# Patient Record
Sex: Male | Born: 1990 | Race: White | Hispanic: No | Marital: Single | Smoking: Current every day smoker
Health system: Southern US, Community
[De-identification: ages and names within clinical notes are randomized; demographics above are authoritative.]

## PROBLEM LIST (undated history)

## (undated) HISTORY — PX: WISDOM TOOTH EXTRACTION: SHX21

---
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
## 2015-08-20 ENCOUNTER — Emergency Department (HOSPITAL_COMMUNITY)
Admission: EM | Admit: 2015-08-20 | Discharge: 2015-08-20 | Disposition: A | Discharge status: Home / Self Care | Payer: BLUE CROSS/BLUE SHIELD | Attending: Emergency Medicine | Admitting: Emergency Medicine

## 2015-08-20 ENCOUNTER — Encounter (HOSPITAL_COMMUNITY): Payer: Self-pay

## 2015-08-20 DIAGNOSIS — K0889 Other specified disorders of teeth and supporting structures: Secondary | ICD-10-CM | POA: Diagnosis present

## 2015-08-20 DIAGNOSIS — K029 Dental caries, unspecified: Secondary | ICD-10-CM | POA: Insufficient documentation

## 2015-08-20 DIAGNOSIS — F17210 Nicotine dependence, cigarettes, uncomplicated: Secondary | ICD-10-CM | POA: Insufficient documentation

## 2015-08-20 MED ORDER — KETOROLAC TROMETHAMINE 10 MG PO TABS
10.0000 mg | ORAL_TABLET | Freq: Once | ORAL | Status: AC
  Administered 2015-08-20: 10 mg via ORAL
  Filled 2015-08-20: qty 1

## 2015-08-20 MED ORDER — CLINDAMYCIN HCL 150 MG PO CAPS
300.0000 mg | ORAL_CAPSULE | Freq: Once | ORAL | Status: AC
  Administered 2015-08-20: 300 mg via ORAL
  Filled 2015-08-20: qty 2

## 2015-08-20 MED ORDER — CLINDAMYCIN HCL 300 MG PO CAPS: 300.0000 mg | ORAL_CAPSULE | Freq: Three times a day (TID) | ORAL | Status: DC

## 2015-08-20 MED ORDER — ONDANSETRON HCL 4 MG PO TABS
4.0000 mg | ORAL_TABLET | Freq: Once | ORAL | Status: AC
  Administered 2015-08-20: 4 mg via ORAL
  Filled 2015-08-20: qty 1

## 2015-08-20 MED ORDER — TRAMADOL HCL 50 MG PO TABS: 50.0000 mg | ORAL_TABLET | Freq: Four times a day (QID) | ORAL | Status: DC | PRN

## 2015-08-20 MED ORDER — CLINDAMYCIN HCL 150 MG PO CAPS: ORAL_CAPSULE | ORAL | Status: DC

## 2015-08-20 NOTE — Discharge Instructions (Signed)
It is important that you see a dentist systems possible. Please use 600 mg of ibuprofen every 6 hours for mild pain, use Ultram every 6 hours for more severe pain. Please use clindamycin 3 times daily with food until all taken. Dental Caries Dental caries is tooth decay. This decay can cause a hole in teeth (cavity) that can get bigger and deeper over time. HOME CARE  Brush and floss your teeth. Do this at least two times a day.  Use a fluoride toothpaste.  Use a mouth rinse if told by your dentist or doctor.  Eat less sugary and starchy foods. Drink less sugary drinks.  Avoid snacking often on sugary and starchy foods. Avoid sipping often on sugary drinks.  Keep regular checkups and cleanings with your dentist.  Use fluoride supplements if told by your dentist or doctor.  Allow fluoride to be applied to teeth if told by your dentist or doctor.   This information is not intended to replace advice given to you by your health care provider. Make sure you discuss any questions you have with your health care provider.   Document Released: 01/26/2008 Document Revised: 05/09/2014 Document Reviewed: 04/20/2012 Elsevier Interactive Patient Education 2016 [address] is a great source of information about community services available.  Access by dialing 2-1-1 from anywhere in [place], or by website -  CustodianSupply.fi.   Other Local Resources (Updated 05/2015)  Dental  Care   Services    Phone Number and Address  Cost  [facility] For children 73 - 66 years of age:   Cleaning  Tooth brushing/flossing instruction  Sealants, fillings, crowns  Extractions  Emergency treatment  [phone] [address], [place] [address] Charges based on family income.  Medicaid and some insurance plans accepted.     [facility] - [facility], fillings, crowns  Extractions  Emergency treatment [phone] [address], [place]  Pregnant women 75 years of age or older with a Medicaid card  [facility] - [place]  Cleaning  Sealants, fillings, crowns  Extractions  Emergency treatment [phone] [address] [place], [place] Pregnant women 77 years of age or older with a Medicaid card  [facility] For children 10 - 62 years of age:   Cleaning  Tooth brushing/flossing instruction  Sealants, fillings, crowns  Extractions  Emergency treatment Limited orthodontic services for patients with Medicaid [phone] [address], [place] [address] Medicaid and [facility] Health Choice cover for children up to age 70 and pregnant women.  Parents of children up to age 71 without Medicaid pay a reduced fee at time of service.  [facility] For children 32 - 4 years of age:   Cleaning  Tooth brushing/flossing instruction  Sealants, fillings, crowns  Extractions  Emergency treatment Limited orthodontic services for patients with Medicaid [phone] [place], [place].  Medicaid and [facility] Health Choice cover for children up to age 36 and pregnant women.  Parents of children up to age 53 without Medicaid pay a reduced fee.  [facility]  Sealants, fillings, crowns  Extractions  Hours: Tuesdays and Thursdays, 4:15 - 8 pm [phone] [address] [place], [place] [address] Services free of  charge to [facility] residents ages 18-64 who do not have health insurance, Medicare, [place], or [place] benefits and fall within [place] care in addition to primary medical care, nutritional counseling, and  pharmacy:  Cleaning  Sealants, fillings, crowns  Extractions                  [phone] [facility], [place], [place], [place], [place], [place] [facility], [place], [place] [facility] [place], [place], [place], most insurance.  Also provides services available to all with fees adjusted based on ability to pay.    [facility]  Cleaning  Tooth brushing/flossing instruction  Sealants, fillings, crowns  Extractions  Emergency treatment Hours: Tuesdays, Thursdays, and Fridays from 8 am to 5 pm by appointment only. [phone] [place], [place] [address] [facility] residents with Medicaid (depending on eligibility) and children with [facility] Health Choice - call for more information.  [facility]  Extractions only  Hours: 2nd and 4th Thursday of each month from 6:30 am - 9 am.   [phone] ext. [address] [place], [place] [address] Ages 7 and older only.  Patients are seen on a first come, first served basis.  [employer]  [employer]  Extractions  Orthodontics  Endodontics  Implants/Crowns/Bridges  Complete and partial dentures [phone] [place], [place] Patients must complete an application for services.  There is often a waiting list.

## 2015-08-20 NOTE — ED Provider Notes (Signed)
CSN: [id number]     Arrival date & time 08/20/15  2138 History   First MD Initiated Contact with Patient 08/20/15 2210     Chief Complaint  Patient presents with  . Dental Pain     (Consider location/radiation/quality/duration/timing/severity/associated sxs/prior Treatment) Patient is a 25 y.o. male presenting with tooth pain. The history is provided by the patient.  Dental Pain Location:  Lower Context: dental caries and poor dentition   Relieved by:  Nothing Worsened by:  Cold food/drink Associated symptoms: facial pain   Associated symptoms: no fever and no trismus   Risk factors: lack of dental care and smoking   Risk factors: no diabetes     History reviewed. No pertinent past medical history. History reviewed. No pertinent past surgical history. No family history on file. Social History  Substance Use Topics  . Smoking status: Current Every Day Smoker -- 0.50 packs/day    Types: Cigarettes  . Smokeless tobacco: None  . Alcohol Use: No    Review of Systems  Constitutional: Negative for fever.  HENT: Positive for dental problem.   All other systems reviewed and are negative.     Allergies  Codeine and Penicillins  Home Medications   Prior to Admission medications   Not on File   BP 128/80 mmHg  Pulse 75  Temp(Src) 98 F (36.7 C) (Oral)  Ht 5\' 8"  (1.727 m)  Wt 108.863 kg  BMI 36.50 kg/m2  SpO2 100% Physical Exam  Constitutional: He is oriented to person, place, and time. He appears well-developed and well-nourished.  Non-toxic appearance.  HENT:  Head: Normocephalic.  Right Ear: Tympanic membrane and external ear normal.  Left Ear: Tympanic membrane and external ear normal.  There are deep cavities of the right lower molars. There is swelling of the gum at the right lower molar area. There are also deep cavities of the left lower molar area. There is no swelling under the tongue. The airway is patent.  Eyes: EOM and lids are normal. Pupils are equal,  round, and reactive to light.  Neck: Normal range of motion. Neck supple. Carotid bruit is not present.  Cardiovascular: Normal rate, regular rhythm, normal heart sounds, intact distal pulses and normal pulses.   Pulmonary/Chest: Breath sounds normal. No respiratory distress.  Abdominal: Soft. Bowel sounds are normal. There is no tenderness. There is no guarding.  Musculoskeletal: Normal range of motion.  Lymphadenopathy:       Head (right side): No submandibular adenopathy present.       Head (left side): No submandibular adenopathy present.    He has no cervical adenopathy.  Neurological: He is alert and oriented to person, place, and time. He has normal strength. No cranial nerve deficit or sensory deficit.  Skin: Skin is warm and dry.  Psychiatric: He has a normal mood and affect. His speech is normal.  Nursing note and vitals reviewed.   ED Course  Procedures (including critical care time) Labs Review Labs Reviewed - No data to display  Imaging Review No results found. I have personally reviewed and evaluated these images and lab results as part of my medical decision-making.   EKG Interpretation None      MDM  Vital signs are well within normal limits. There is no evidence for Ludwig's angina or other acute problems. The patient will be treated with clindamycin and ibuprofen and Ultram. The patient is given none dental resource list.    Final diagnoses:  None    *I have  reviewed nursing notes, vital signs, and all appropriate lab and imaging results for this patient.[address] PA-C 08/20/15 2222  Tanna Furry, MD 08/31/15 [phone]

## 2015-08-20 NOTE — ED Notes (Signed)
Started having tooth pain yesterday, hurting on the right lower side per pt.

## 2016-01-30 ENCOUNTER — Emergency Department (HOSPITAL_COMMUNITY)
Admission: EM | Admit: 2016-01-30 | Discharge: 2016-01-30 | Disposition: A | Discharge status: Home / Self Care | Payer: BLUE CROSS/BLUE SHIELD | Attending: Emergency Medicine | Admitting: Emergency Medicine

## 2016-01-30 ENCOUNTER — Emergency Department (HOSPITAL_COMMUNITY): Payer: BLUE CROSS/BLUE SHIELD

## 2016-01-30 ENCOUNTER — Encounter (HOSPITAL_COMMUNITY): Payer: Self-pay | Admitting: Emergency Medicine

## 2016-01-30 DIAGNOSIS — Y929 Unspecified place or not applicable: Secondary | ICD-10-CM | POA: Diagnosis not present

## 2016-01-30 DIAGNOSIS — S62306A Unspecified fracture of fifth metacarpal bone, right hand, initial encounter for closed fracture: Secondary | ICD-10-CM

## 2016-01-30 DIAGNOSIS — Y999 Unspecified external cause status: Secondary | ICD-10-CM | POA: Diagnosis not present

## 2016-01-30 DIAGNOSIS — S62316A Displaced fracture of base of fifth metacarpal bone, right hand, initial encounter for closed fracture: Secondary | ICD-10-CM | POA: Insufficient documentation

## 2016-01-30 DIAGNOSIS — Y9389 Activity, other specified: Secondary | ICD-10-CM | POA: Insufficient documentation

## 2016-01-30 DIAGNOSIS — Z791 Long term (current) use of non-steroidal anti-inflammatories (NSAID): Secondary | ICD-10-CM | POA: Diagnosis not present

## 2016-01-30 DIAGNOSIS — F17210 Nicotine dependence, cigarettes, uncomplicated: Secondary | ICD-10-CM | POA: Insufficient documentation

## 2016-01-30 DIAGNOSIS — S6991XA Unspecified injury of right wrist, hand and finger(s), initial encounter: Secondary | ICD-10-CM | POA: Diagnosis present

## 2016-01-30 MED ORDER — CLINDAMYCIN HCL 300 MG PO CAPS: 300.0000 mg | ORAL_CAPSULE | Freq: Four times a day (QID) | ORAL | 0 refills | Status: DC

## 2016-01-30 MED ORDER — SULFAMETHOXAZOLE-TRIMETHOPRIM 800-160 MG PO TABS: 1.0000 | ORAL_TABLET | Freq: Two times a day (BID) | ORAL | 0 refills | Status: AC

## 2016-01-30 NOTE — ED Notes (Signed)
Patient transported to X-ray 
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
## 2016-01-30 NOTE — ED Notes (Signed)
Pt returned from xray
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
## 2016-06-13 ENCOUNTER — Encounter (HOSPITAL_COMMUNITY): Payer: Self-pay | Admitting: Emergency Medicine

## 2016-06-13 ENCOUNTER — Emergency Department (HOSPITAL_COMMUNITY)
Admission: EM | Admit: 2016-06-13 | Discharge: 2016-06-14 | Disposition: A | Discharge status: Home / Self Care | Payer: BLUE CROSS/BLUE SHIELD | Attending: Emergency Medicine | Admitting: Emergency Medicine

## 2016-06-13 DIAGNOSIS — R197 Diarrhea, unspecified: Secondary | ICD-10-CM | POA: Diagnosis not present

## 2016-06-13 DIAGNOSIS — R112 Nausea with vomiting, unspecified: Secondary | ICD-10-CM | POA: Diagnosis present

## 2016-06-13 DIAGNOSIS — F17210 Nicotine dependence, cigarettes, uncomplicated: Secondary | ICD-10-CM | POA: Diagnosis not present

## 2016-06-13 DIAGNOSIS — Z791 Long term (current) use of non-steroidal anti-inflammatories (NSAID): Secondary | ICD-10-CM | POA: Diagnosis not present

## 2016-06-13 LAB — COMPREHENSIVE METABOLIC PANEL
ALT: 30 U/L (ref 17–63)
AST: 26 U/L (ref 15–41)
Albumin: 4.4 g/dL (ref 3.5–5.0)
Alkaline Phosphatase: 84 U/L (ref 38–126)
Anion gap: 11 (ref 5–15)
BUN: 17 mg/dL (ref 6–20)
CHLORIDE: 104 mmol/L (ref 101–111)
CO2: 24 mmol/L (ref 22–32)
CREATININE: 1.02 mg/dL (ref 0.61–1.24)
Calcium: 9.3 mg/dL (ref 8.9–10.3)
GFR calc non Af Amer: >60 mL/min (ref >60)
Glucose, Bld: 122 mg/dL — ABNORMAL HIGH (ref 65–99)
POTASSIUM: 3.7 mmol/L (ref 3.5–5.1)
SODIUM: 139 mmol/L (ref 135–145)
Total Bilirubin: 0.7 mg/dL (ref 0.3–1.2)
Total Protein: 7.6 g/dL (ref 6.5–8.1)

## 2016-06-13 LAB — CBC
HEMATOCRIT: 51 % (ref 39.0–52.0)
HEMOGLOBIN: 18 g/dL — AB (ref 13.0–17.0)
MCH: 31.4 pg (ref 26.0–34.0)
MCHC: 35.3 g/dL (ref 30.0–36.0)
MCV: 88.9 fL (ref 78.0–100.0)
PLATELETS: 250 10*3/uL (ref 150–400)
RBC: 5.74 MIL/uL (ref 4.22–5.81)
RDW: 13 % (ref 11.5–15.5)
WBC: 13.8 10*3/uL — AB (ref 4.0–10.5)

## 2016-06-13 LAB — LIPASE, BLOOD: LIPASE: 14 U/L (ref 11–51)

## 2016-06-13 NOTE — ED Triage Notes (Signed)
Per pt has been vomiting and having diarrhea all day

## 2016-06-14 MED ORDER — ONDANSETRON 4 MG PO TBDP: 4.0000 mg | ORAL_TABLET | Freq: Three times a day (TID) | ORAL | 0 refills | Status: DC | PRN

## 2016-06-14 MED ORDER — ONDANSETRON 4 MG PO TBDP
4.0000 mg | ORAL_TABLET | Freq: Once | ORAL | Status: AC
  Administered 2016-06-14: 4 mg via ORAL
  Filled 2016-06-14: qty 1

## 2016-06-14 NOTE — Discharge Instructions (Signed)
The workup we did in the emergency department is overall normal. Urine nausea, vomiting and diarrhea are most likely due to a viral gastroenteritis or stomach bug.  Please read attached information on nausea, vomiting, diarrhea and viral gastroenteritis.   Make sure you stay well hydrated, drink plenty of water.    Good hand hygiene is important to prevent spread of your symptoms to those around you. You may return to work if you feel well, do not have a fever and are not vomiting or having diarrhea.  You may use your work note as needed.    Your symptoms should start to improve in the next 2-3 days.    Please take zofran for nausea as needed.  Your diarrhea should start slowing down over the next couple of days.  You may take over the counter imodium for day time disruptive diarrhea.    Avoid irritating foods including greasy fried food, spicy food, coffee, alcohol and dairy until your symptoms have improved.   Return to the emergency department if your symptoms do not improve or worsen in the next 3-5 days or if you unable to keep fluids down, develop fever, abdominal pain or blood in stool.

## 2016-06-14 NOTE — ED Provider Notes (Signed)
[facility] DEPT Provider Note   CSN: [id number] Arrival date & time: 06/13/16  2235     History   Chief Complaint Chief Complaint  Patient presents with  . Emesis  . Diarrhea    HPI Cameron Chavez is a 26 y.o. male with no known past medical or surgical history presents to ED with sudden onset nausea, non bloody vomiting and non bloody/non mucoid diarrhea since 1600 today.  Patient states he had chili and a patty melt at work around noon today for lunch.  Patient denies fever, abdominal pain, dark/black stools, abdominal distention. No URI symptoms including nasal congestion, rhinorrhea, cough, sore throat, body aches.  No recent travel, intake of unfiltered water, antibiotics, fish or shellfish intake.  No previous abdominal surgeries. No new medications.  No heavy use of NSAIDs or ETOH. No known PUD.    HPI  History reviewed. No pertinent past medical history.  There are no active problems to display for this patient.   Past Surgical History:  Procedure Laterality Date  . WISDOM TOOTH EXTRACTION         Home Medications    Prior to Admission medications   Medication Sig Start Date End Date Taking? Authorizing Provider  clindamycin (CLEOCIN) 300 MG capsule Take 1 capsule (300 mg total) by mouth 4 (four) times daily. X 7 days 01/30/16   [name], MD  ibuprofen (ADVIL,MOTRIN) 200 MG tablet Take 200 mg by mouth every 6 (six) hours as needed for mild pain or moderate pain.    Historical Provider, MD  ondansetron (ZOFRAN ODT) 4 MG disintegrating tablet Take 1 tablet (4 mg total) by mouth every 8 (eight) hours as needed for nausea or vomiting. 06/14/16   [name], PA-C    Family History Family History  Problem Relation Age of Onset  . Diabetes Other     Social History Social History  Substance Use Topics  . Smoking status: Current Every Day Smoker    Packs/day: 0.50    Years: 10.00    Types: Cigarettes  . Smokeless tobacco: Never Used  . Alcohol  use No     Allergies   Codeine and Penicillins   Review of Systems Review of Systems  Constitutional: Negative for chills and fever.  HENT: Negative for congestion, postnasal drip, rhinorrhea and sore throat.   Eyes: Negative for visual disturbance.  Respiratory: Negative for cough and shortness of breath.   Cardiovascular: Negative for chest pain and palpitations.  Gastrointestinal: Positive for diarrhea, nausea and vomiting. Negative for abdominal pain, anal bleeding, blood in stool and constipation.  Genitourinary: Negative for difficulty urinating, flank pain and hematuria.  Musculoskeletal: Negative for joint swelling and myalgias.  Skin: Negative for rash.  Neurological: Negative for dizziness, syncope, weakness, light-headedness and headaches.  Hematological: Negative.   Psychiatric/Behavioral: Negative.      Physical Exam Updated Vital Signs BP 121/87 (BP Location: Left Arm)   Pulse 115   Temp 98.9 F (37.2 C) (Oral)   Resp 20   Ht 5\' 10"  (1.778 m)   Wt 106.6 kg   SpO2 97%   BMI 33.72 kg/m   Physical Exam  Constitutional: He is oriented to person, place, and time. He appears well-developed and well-nourished. No distress.  NAD.  HENT:  Head: Normocephalic and atraumatic.  Nose: Nose normal.  Mouth/Throat: Oropharynx is clear and moist. No oropharyngeal exudate.  Moist mucous membranes.  Oropharynx and tonsils pink without erythema, edema, exudates or lesions.  Uvula midline. No trismus.  Eyes: Conjunctivae and EOM are normal. Pupils are equal, round, and reactive to light.  Neck: Normal range of motion. Neck supple. No JVD present. No tracheal deviation present.  Cardiovascular: Normal rate, regular rhythm, normal heart sounds and intact distal pulses.   No murmur heard. Pulmonary/Chest: Effort normal and breath sounds normal. No respiratory distress. He has no wheezes. He has no rales.  Abdominal:  No surgical abdominal scars noted.  No pulsating  masses.  No abdominal or inguinal hernias noted. + Bowel sounds throughout.  Abdomen is soft, non tender without distention, rigidity, guarding or rebound.  No suprapubic tenderness. No CVAT.  Negative Murphy's. Negative McBurney's.  Non palpable kidneys. No hepatosplenomegaly.   Musculoskeletal: Normal range of motion. He exhibits no deformity.  Lymphadenopathy:    He has no cervical adenopathy.  Neurological: He is alert and oriented to person, place, and time.  Skin: Skin is warm and dry. Capillary refill takes less than 2 seconds.  Psychiatric: He has a normal mood and affect. His behavior is normal. Judgment and thought content normal.  Nursing note and vitals reviewed.    ED Treatments / Results  Labs (all labs ordered are listed, but only abnormal results are displayed) Labs Reviewed  COMPREHENSIVE METABOLIC PANEL - Abnormal; Notable for the following:       Result Value   Glucose, Bld 122 (*)    All other components within normal limits  CBC - Abnormal; Notable for the following:    WBC 13.8 (*)    Hemoglobin 18.0 (*)    All other components within normal limits  LIPASE, BLOOD    EKG  EKG Interpretation None       Radiology No results found.  Procedures Procedures (including critical care time)  Medications Ordered in ED Medications  ondansetron (ZOFRAN-ODT) disintegrating tablet 4 mg (4 mg Oral Given 06/14/16 0027)     Initial Impression / Assessment and Plan / ED Course  I have reviewed the triage vital signs and the nursing notes.  Pertinent labs & imaging results that were available during my care of the patient were reviewed by me and considered in my medical decision making (see chart for details).     26 y.o. yo male with no pertinent past medical or surgical history presents with nausea, NBNB vomiting and non bloody/non mucoid diarrhea since 1600 today.  ANo recent travel, intake of unfiltered water, intake of fish/shellfish or uncooked meat,  no recent antibiotics.  Patient is immunocompetent.  Vital signs within normal limits.  Patient is non toxic appearing, does not appear to be volume depleted.  Non tender abdomen.  Lab work normal. Patient was given zofran and PO fluids in the ED for symptoms.  Patient tolerated PO challenge.  Suspect this is noninflammatory acute diarrhea most likely viral in nature, possibly due to food poisoning.  Doubt bacterial or parasitic noninflammatory etiologies.  Doubt inflammatory etiologies. Doubt c.diff.  Doubt GIB. Patient will be discharged with instructions to hydrate and conservative therapies for nausea, vomiting and diarrhea including imodium.  Strict ED return precautions. Patient verbalized understanding and agreeable to discharge plan.    Final Clinical Impressions(s) / ED Diagnoses   Final diagnoses:  Nausea vomiting and diarrhea    New Prescriptions Discharge Medication List as of 06/14/2016  1:12 AM    START taking these medications   Details  ondansetron (ZOFRAN ODT) 4 MG disintegrating tablet Take 1 tablet (4 mg total) by mouth every 8 (eight) hours as needed for nausea  or vomiting., Starting Tue 06/14/2016, Print         [name], PA-C 06/14/16 [place], PA-C 06/14/16 [id number]    Fatima Blank, MD 06/14/16 1505

## 2016-06-14 NOTE — ED Notes (Signed)
Pt ambulatory to waiting room. Pt verbalized understanding of discharge instructions.   

## 2016-06-14 NOTE — ED Notes (Signed)
Pt denies N/V after fluid challenge.

## 2016-07-17 ENCOUNTER — Emergency Department (HOSPITAL_COMMUNITY)
Admission: EM | Admit: 2016-07-17 | Discharge: 2016-07-17 | Disposition: A | Discharge status: Home / Self Care | Payer: BLUE CROSS/BLUE SHIELD | Attending: Emergency Medicine | Admitting: Emergency Medicine

## 2016-07-17 ENCOUNTER — Emergency Department (HOSPITAL_COMMUNITY): Payer: BLUE CROSS/BLUE SHIELD

## 2016-07-17 ENCOUNTER — Encounter (HOSPITAL_COMMUNITY): Payer: Self-pay | Admitting: Emergency Medicine

## 2016-07-17 DIAGNOSIS — Y9389 Activity, other specified: Secondary | ICD-10-CM | POA: Diagnosis not present

## 2016-07-17 DIAGNOSIS — Y929 Unspecified place or not applicable: Secondary | ICD-10-CM | POA: Insufficient documentation

## 2016-07-17 DIAGNOSIS — W2201XA Walked into wall, initial encounter: Secondary | ICD-10-CM | POA: Diagnosis not present

## 2016-07-17 DIAGNOSIS — S62306A Unspecified fracture of fifth metacarpal bone, right hand, initial encounter for closed fracture: Secondary | ICD-10-CM | POA: Insufficient documentation

## 2016-07-17 DIAGNOSIS — T1490XA Injury, unspecified, initial encounter: Secondary | ICD-10-CM

## 2016-07-17 DIAGNOSIS — S6991XA Unspecified injury of right wrist, hand and finger(s), initial encounter: Secondary | ICD-10-CM | POA: Diagnosis present

## 2016-07-17 DIAGNOSIS — F17210 Nicotine dependence, cigarettes, uncomplicated: Secondary | ICD-10-CM | POA: Insufficient documentation

## 2016-07-17 DIAGNOSIS — Y999 Unspecified external cause status: Secondary | ICD-10-CM | POA: Insufficient documentation

## 2016-07-17 DIAGNOSIS — S62339A Displaced fracture of neck of unspecified metacarpal bone, initial encounter for closed fracture: Secondary | ICD-10-CM

## 2016-07-17 MED ORDER — HYDROCODONE-ACETAMINOPHEN 5-325 MG PO TABS
1.0000 | ORAL_TABLET | Freq: Once | ORAL | Status: DC
  Filled 2016-07-17: qty 1

## 2016-07-17 MED ORDER — HYDROCODONE-ACETAMINOPHEN 5-325 MG PO TABS: ORAL_TABLET | ORAL | 0 refills | Status: DC

## 2016-07-17 MED ORDER — IBUPROFEN 600 MG PO TABS: 600.0000 mg | ORAL_TABLET | Freq: Four times a day (QID) | ORAL | 0 refills | Status: DC | PRN
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
## 2016-07-17 NOTE — Discharge Instructions (Signed)
Elevate your hand when possible.  Call the orthopedic doctor to arrange a follow-up appt
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
## 2016-07-21 ENCOUNTER — Telehealth: Payer: Self-pay | Admitting: Orthopedic Surgery

## 2016-07-21 NOTE — Telephone Encounter (Signed)
Patient had left a voice message yesterday, 07/20/16, however, no phone # or date of birth given; call received back today, requesting appointment for problem of hand fracture, per [facility] emergency room visit 07/17/16. Offered appointment for today, pending call back from patient, as trying to arrange time away from work.

## 2016-07-22 NOTE — Telephone Encounter (Signed)
No response as of today, Friday, 07/22/16, to the offer of appt for this week.

## 2016-07-26 NOTE — Telephone Encounter (Signed)
Per call back, patient has already been seeing orthopaedist in [place].

## 2016-07-26 NOTE — Telephone Encounter (Signed)
I've called back again to patient, to follow up; voice messages are full; unable to leave message.

## 2017-06-28 ENCOUNTER — Emergency Department (HOSPITAL_COMMUNITY)
Admission: EM | Admit: 2017-06-28 | Discharge: 2017-06-28 | Disposition: A | Discharge status: Home / Self Care | Payer: Self-pay | Attending: Emergency Medicine | Admitting: Emergency Medicine

## 2017-06-28 ENCOUNTER — Emergency Department (HOSPITAL_COMMUNITY): Payer: Self-pay

## 2017-06-28 ENCOUNTER — Encounter (HOSPITAL_COMMUNITY): Payer: Self-pay | Admitting: Emergency Medicine

## 2017-06-28 ENCOUNTER — Other Ambulatory Visit: Payer: Self-pay

## 2017-06-28 DIAGNOSIS — J111 Influenza due to unidentified influenza virus with other respiratory manifestations: Secondary | ICD-10-CM | POA: Insufficient documentation

## 2017-06-28 DIAGNOSIS — R6889 Other general symptoms and signs: Secondary | ICD-10-CM

## 2017-06-28 DIAGNOSIS — F17210 Nicotine dependence, cigarettes, uncomplicated: Secondary | ICD-10-CM | POA: Insufficient documentation

## 2017-06-28 MED ORDER — BENZONATATE 100 MG PO CAPS
200.0000 mg | ORAL_CAPSULE | Freq: Once | ORAL | Status: AC
  Administered 2017-06-28: 200 mg via ORAL
  Filled 2017-06-28: qty 2

## 2017-06-28 MED ORDER — OSELTAMIVIR PHOSPHATE 75 MG PO CAPS: 75.0000 mg | ORAL_CAPSULE | Freq: Two times a day (BID) | ORAL | 0 refills | Status: DC

## 2017-06-28 MED ORDER — ACETAMINOPHEN 325 MG PO TABS
650.0000 mg | ORAL_TABLET | Freq: Once | ORAL | Status: AC | PRN
  Administered 2017-06-28: 650 mg via ORAL
  Filled 2017-06-28: qty 2

## 2017-06-28 MED ORDER — BENZONATATE 100 MG PO CAPS: 200.0000 mg | ORAL_CAPSULE | Freq: Three times a day (TID) | ORAL | 0 refills | Status: DC | PRN

## 2017-06-28 NOTE — Discharge Instructions (Signed)
Rest,  Drink plenty of fluids.  Take motrin or tylenol for achiness and fever reduction.  You may take the tamiflu if you desire.  This medicine may improve your flu symptoms 1-2 days sooner.  Get rechecked for increased shortness of breath,  Increased fever or increasing weakness.  

## 2017-06-28 NOTE — ED Triage Notes (Signed)
Pt c/o headache, cough sore throat, body aches and chills since yesterday.

## 2017-06-29 NOTE — ED Provider Notes (Signed)
[facility] EMERGENCY DEPARTMENT Provider Note   CSN: [id number] Arrival date & time: 06/28/17  1821     History   Chief Complaint Chief Complaint  Patient presents with  . Generalized Body Aches    HPI NELLO CORRO is a 27 y.o. male presenting with a 1 day history of flu like  symptoms which includes rather sudden onset of fever and chills, body aches headache, sore throat and productive cough (green and brown sputum) starting yesterday.   Symptoms do not include shortness of breath, chest pain,  Nausea or diarrhea but endorses several episodes of post tussive emesis.  The patient has taken an otc cold/flu relief remedy  prior to arrival with transient improvement in symptoms. .  The history is provided by the patient.    History reviewed. No pertinent past medical history.  There are no active problems to display for this patient.   Past Surgical History:  Procedure Laterality Date  . WISDOM TOOTH EXTRACTION         Home Medications    Prior to Admission medications   Medication Sig Start Date End Date Taking? Authorizing Provider  DM-Phenylephrine-Acetaminophen (COLD/FLU RELIEF) 10-5-325 MG/15ML LIQD Take 15-30 mLs by mouth daily as needed (for cold/flu symptoms).   Yes [provider]  benzonatate (TESSALON) 100 MG capsule Take 2 capsules (200 mg total) by mouth 3 (three) times daily as needed. 06/28/17   Evalee Jefferson, PA-C  oseltamivir (TAMIFLU) 75 MG capsule Take 1 capsule (75 mg total) by mouth every 12 (twelve) hours. 06/28/17   Evalee Jefferson, PA-C    Family History Family History  Problem Relation Age of Onset  . Diabetes Other     Social History Social History   Tobacco Use  . Smoking status: Current Every Day Smoker    Packs/day: 0.50    Years: 10.00    Pack years: 5.00    Types: Cigarettes  . Smokeless tobacco: Never Used  Substance Use Topics  . Alcohol use: No  . Drug use: No     Allergies   Codeine and Penicillins   Review  of Systems Review of Systems  Constitutional: Positive for chills and fever.  HENT: Positive for congestion and sore throat. Negative for ear pain, rhinorrhea, sinus pressure, trouble swallowing and voice change.   Eyes: Negative for discharge.  Respiratory: Positive for cough. Negative for shortness of breath, wheezing and stridor.   Cardiovascular: Negative for chest pain and palpitations.  Gastrointestinal: Positive for vomiting. Negative for abdominal pain and nausea.  Genitourinary: Negative.      Physical Exam Updated Vital Signs BP 128/77 (BP Location: Right Arm)   Pulse 95   Temp (!) 100.9 F (38.3 C) (Oral)   Resp 18   Ht 5\' 10"  (1.778 m)   Wt 106.6 kg (235 lb)   SpO2 100%   BMI 33.72 kg/m   Physical Exam  Constitutional: He is oriented to person, place, and time. He appears well-developed and well-nourished.  HENT:  Head: Normocephalic and atraumatic.  Right Ear: Tympanic membrane and ear canal normal.  Left Ear: Tympanic membrane and ear canal normal.  Nose: Rhinorrhea present. No mucosal edema.  Mouth/Throat: Uvula is midline, oropharynx is clear and moist and mucous membranes are normal. No oropharyngeal exudate, posterior oropharyngeal edema, posterior oropharyngeal erythema or tonsillar abscesses.  Eyes: Conjunctivae are normal.  Cardiovascular: Normal rate and normal heart sounds.  Pulmonary/Chest: Effort normal. No respiratory distress. He has no wheezes. He has no rales. He  exhibits no tenderness.  Abdominal: Soft. There is no tenderness.  Musculoskeletal: Normal range of motion.  Neurological: He is alert and oriented to person, place, and time.  Skin: Skin is warm and dry. No rash noted.  Psychiatric: He has a normal mood and affect.     ED Treatments / Results  Labs (all labs ordered are listed, but only abnormal results are displayed) Labs Reviewed - No data to display  EKG  EKG Interpretation None       Radiology Dg Chest 2  View  Result Date: 06/28/2017 CLINICAL DATA:  Cough EXAM: CHEST  2 VIEW COMPARISON:  None. FINDINGS: The heart size and mediastinal contours are within normal limits. Both lungs are clear. The visualized skeletal structures are unremarkable. IMPRESSION: No active cardiopulmonary disease. Electronically Signed   By: Donavan Foil M.D.   On: 06/28/2017 22:08    Procedures Procedures (including critical care time)  Medications Ordered in ED Medications  acetaminophen (TYLENOL) tablet 650 mg (650 mg Oral Given 06/28/17 1911)  benzonatate (TESSALON) capsule 200 mg (200 mg Oral Given 06/28/17 2252)     Initial Impression / Assessment and Plan / ED Course  I have reviewed the triage vital signs and the nursing notes.  Pertinent labs & imaging results that were available during my care of the patient were reviewed by me and considered in my medical decision making (see chart for details).     Imaging reviewed, no pneumonia. Pt with sx of influenza. Advised rest, push fluids, tylenol/motrin for fever reduction, myalgias. Discussed tamiflu which pt is desirous of taking. Tessalon for cough. Return precautions discussed.  Final Clinical Impressions(s) / ED Diagnoses   Final diagnoses:  Flu-like symptoms    ED Discharge Orders        Ordered    benzonatate (TESSALON) 100 MG capsule  3 times daily PRN     06/28/17 2238    oseltamivir (TAMIFLU) 75 MG capsule  Every 12 hours     06/28/17 2240       Evalee Jefferson, PA-C 06/29/17 1217    Fredia Sorrow, MD 06/29/17 [phone]

## 2017-07-08 ENCOUNTER — Encounter (HOSPITAL_COMMUNITY): Payer: Self-pay | Admitting: Emergency Medicine

## 2017-07-08 ENCOUNTER — Emergency Department (HOSPITAL_COMMUNITY)
Admission: EM | Admit: 2017-07-08 | Discharge: 2017-07-08 | Disposition: A | Discharge status: Home / Self Care | Payer: Self-pay | Attending: Emergency Medicine | Admitting: Emergency Medicine

## 2017-07-08 DIAGNOSIS — Z202 Contact with and (suspected) exposure to infections with a predominantly sexual mode of transmission: Secondary | ICD-10-CM

## 2017-07-08 DIAGNOSIS — A599 Trichomoniasis, unspecified: Secondary | ICD-10-CM | POA: Insufficient documentation

## 2017-07-08 DIAGNOSIS — F17210 Nicotine dependence, cigarettes, uncomplicated: Secondary | ICD-10-CM | POA: Insufficient documentation

## 2017-07-08 LAB — URINALYSIS, ROUTINE W REFLEX MICROSCOPIC
Bilirubin Urine: NEGATIVE
GLUCOSE, UA: NEGATIVE mg/dL
Hgb urine dipstick: NEGATIVE
Ketones, ur: NEGATIVE mg/dL
LEUKOCYTES UA: NEGATIVE
Nitrite: NEGATIVE
PH: 5 (ref 5.0–8.0)
PROTEIN: NEGATIVE mg/dL
SPECIFIC GRAVITY, URINE: 1.028 (ref 1.005–1.030)

## 2017-07-08 MED ORDER — METRONIDAZOLE 500 MG PO TABS
2000.0000 mg | ORAL_TABLET | Freq: Once | ORAL | Status: AC
  Administered 2017-07-08: 2000 mg via ORAL
  Filled 2017-07-08: qty 4

## 2017-07-08 MED ORDER — ONDANSETRON 4 MG PO TBDP
4.0000 mg | ORAL_TABLET | Freq: Once | ORAL | Status: AC
  Administered 2017-07-08: 4 mg via ORAL
  Filled 2017-07-08: qty 1

## 2017-07-08 NOTE — ED Triage Notes (Signed)
Patient states he was told he needed be tested due to partner stating she had trichomonas.

## 2017-07-08 NOTE — ED Provider Notes (Signed)
[facility] EMERGENCY DEPARTMENT Provider Note   CSN: [id number] Arrival date & time: 07/08/17  1626     History   Chief Complaint Chief Complaint  Patient presents with  . Exposure to STD    HPI Cameron Chavez is a 27 y.o. male.  The history is provided by the patient. No language interpreter was used.  Exposure to STD  This is a new problem. The problem occurs constantly. The problem has not changed since onset.Nothing aggravates the symptoms. Nothing relieves the symptoms. He has tried nothing for the symptoms. The treatment provided no relief.  Pt reports his partner has trich and was told to come get treated.    History reviewed. No pertinent past medical history.  There are no active problems to display for this patient.   Past Surgical History:  Procedure Laterality Date  . WISDOM TOOTH EXTRACTION         Home Medications    Prior to Admission medications   Medication Sig Start Date End Date Taking? Authorizing Provider  benzonatate (TESSALON) 100 MG capsule Take 2 capsules (200 mg total) by mouth 3 (three) times daily as needed. 06/28/17   Evalee Jefferson, PA-C  DM-Phenylephrine-Acetaminophen (COLD/FLU RELIEF) 10-5-325 MG/15ML LIQD Take 15-30 mLs by mouth daily as needed (for cold/flu symptoms).    [provider]  oseltamivir (TAMIFLU) 75 MG capsule Take 1 capsule (75 mg total) by mouth every 12 (twelve) hours. 06/28/17   Evalee Jefferson, PA-C    Family History Family History  Problem Relation Age of Onset  . Diabetes Other     Social History Social History   Tobacco Use  . Smoking status: Current Every Day Smoker    Packs/day: 0.50    Years: 10.00    Pack years: 5.00    Types: Cigarettes  . Smokeless tobacco: Never Used  Substance Use Topics  . Alcohol use: No  . Drug use: No     Allergies   Codeine and Penicillins   Review of Systems Review of Systems  All other systems reviewed and are negative.    Physical Exam Updated Vital  Signs BP (!) 160/90 (BP Location: Right Arm)   Pulse (!) 109   Temp 98.6 F (37 C) (Oral)   Resp 18   Ht 5\' 10"  (1.778 m)   Wt 106.6 kg (235 lb)   SpO2 97%   BMI 33.72 kg/m   Physical Exam  Constitutional: He appears well-developed and well-nourished.  HENT:  Head: Normocephalic.  Cardiovascular: Normal rate.  Pulmonary/Chest: Effort normal.  Musculoskeletal: Normal range of motion.  Neurological: He is alert.  Skin: Skin is warm.  Psychiatric: He has a normal mood and affect.  Nursing note and vitals reviewed.    ED Treatments / Results  Labs (all labs ordered are listed, but only abnormal results are displayed) Labs Reviewed  URINALYSIS, ROUTINE W REFLEX MICROSCOPIC  GC/CHLAMYDIA PROBE AMP () NOT AT [facility]    EKG  EKG Interpretation None       Radiology No results found.  Procedures Procedures (including critical care time)  Medications Ordered in ED Medications  metroNIDAZOLE (FLAGYL) tablet 2,000 mg (not administered)  ondansetron (ZOFRAN-ODT) disintegrating tablet 4 mg (not administered)     Initial Impression / Assessment and Plan / ED Course  I have reviewed the triage vital signs and the nursing notes.  Pertinent labs & imaging results that were available during my care of the patient were reviewed by me and considered in  my medical decision making (see chart for details).     MDM  Pt has had exposure to trich.   I will test for gc and ct.  Pt given zofran odt and flagyl 2 grams.    Final Clinical Impressions(s) / ED Diagnoses   Final diagnoses:  Trichomoniasis  Possible exposure to STD    ED Discharge Orders    None     No outpatient medications have been marked as taking for the 07/08/17 encounter [facility] Encounter).   An After Visit Summary was printed and given to the patient.    Fransico Meadow, [place] 07/08/17 1716    Noemi Chapel, MD 07/09/17 1450

## 2017-07-10 LAB — GC/CHLAMYDIA PROBE AMP (~~LOC~~) NOT AT ARMC
CHLAMYDIA, DNA PROBE: NEGATIVE
NEISSERIA GONORRHEA: NEGATIVE

## 2017-08-07 ENCOUNTER — Encounter (HOSPITAL_COMMUNITY): Payer: Self-pay | Admitting: Emergency Medicine

## 2017-08-07 ENCOUNTER — Emergency Department (HOSPITAL_COMMUNITY)
Admission: EM | Admit: 2017-08-07 | Discharge: 2017-08-08 | Disposition: A | Discharge status: Home / Self Care | Payer: Self-pay | Attending: Emergency Medicine | Admitting: Emergency Medicine

## 2017-08-07 ENCOUNTER — Other Ambulatory Visit: Payer: Self-pay

## 2017-08-07 DIAGNOSIS — F17210 Nicotine dependence, cigarettes, uncomplicated: Secondary | ICD-10-CM | POA: Insufficient documentation

## 2017-08-07 DIAGNOSIS — N341 Nonspecific urethritis: Secondary | ICD-10-CM | POA: Insufficient documentation

## 2017-08-07 DIAGNOSIS — N342 Other urethritis: Secondary | ICD-10-CM

## 2017-08-07 LAB — URINALYSIS, ROUTINE W REFLEX MICROSCOPIC
Bilirubin Urine: NEGATIVE
GLUCOSE, UA: NEGATIVE mg/dL
HGB URINE DIPSTICK: NEGATIVE
KETONES UR: NEGATIVE mg/dL
Leukocytes, UA: NEGATIVE
NITRITE: NEGATIVE
PH: 7 (ref 5.0–8.0)
Protein, ur: NEGATIVE mg/dL
SPECIFIC GRAVITY, URINE: 1.004 — AB (ref 1.005–1.030)

## 2017-08-07 MED ORDER — DOXYCYCLINE HYCLATE 100 MG PO TABS
100.0000 mg | ORAL_TABLET | Freq: Once | ORAL | Status: AC
  Administered 2017-08-08: 100 mg via ORAL
  Filled 2017-08-07: qty 1

## 2017-08-07 MED ORDER — DOXYCYCLINE HYCLATE 100 MG PO CAPS: 100.0000 mg | ORAL_CAPSULE | Freq: Two times a day (BID) | ORAL | 0 refills | Status: DC
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
## 2017-08-07 NOTE — ED Triage Notes (Signed)
Pain after urinating for past few days

## 2017-08-07 NOTE — Discharge Instructions (Addendum)
USE CONDOMS!!!! Take the antibiotic until gone. You will be called if your STD tests are positive.

## 2017-08-09 LAB — GC/CHLAMYDIA PROBE AMP (~~LOC~~) NOT AT ARMC
Chlamydia: NEGATIVE
Neisseria Gonorrhea: NEGATIVE

## 2017-08-26 ENCOUNTER — Other Ambulatory Visit: Payer: Self-pay

## 2017-08-26 ENCOUNTER — Emergency Department (HOSPITAL_COMMUNITY)
Admission: EM | Admit: 2017-08-26 | Discharge: 2017-08-26 | Disposition: A | Discharge status: Home / Self Care | Payer: Self-pay | Attending: Emergency Medicine | Admitting: Emergency Medicine

## 2017-08-26 ENCOUNTER — Encounter (HOSPITAL_COMMUNITY): Payer: Self-pay | Admitting: Emergency Medicine

## 2017-08-26 DIAGNOSIS — L03115 Cellulitis of right lower limb: Secondary | ICD-10-CM | POA: Insufficient documentation

## 2017-08-26 DIAGNOSIS — F17210 Nicotine dependence, cigarettes, uncomplicated: Secondary | ICD-10-CM | POA: Insufficient documentation

## 2017-08-26 DIAGNOSIS — L03119 Cellulitis of unspecified part of limb: Secondary | ICD-10-CM

## 2017-08-26 MED ORDER — NAPROXEN 500 MG PO TABS: 500.0000 mg | ORAL_TABLET | Freq: Two times a day (BID) | ORAL | 0 refills | Status: DC

## 2017-08-26 MED ORDER — DOXYCYCLINE HYCLATE 100 MG PO CAPS: 100.0000 mg | ORAL_CAPSULE | Freq: Two times a day (BID) | ORAL | 0 refills | Status: DC

## 2017-08-26 NOTE — ED Provider Notes (Signed)
[facility] EMERGENCY DEPARTMENT Provider Note   CSN: [id number] Arrival date & time: 08/26/17  0539     History   Chief Complaint Chief Complaint  Patient presents with  . Foot Pain    HPI Cameron Chavez is a 27 y.o. male.  HPI Pt presents to the ED for evaluation of right foot pain and swelling.  Couple days ago patient noticed some itching on his foot.  Yesterday he noticed some swelling.  This morning when he woke up his foot was more red and swollen.  He continues to notice a small bump on the big toe as well as another spot on his dorsum of his midfoot and another area towards the malleolus.  He denies any fevers.  No known insect bites or stings.  He denies any fevers or chills.  No chest pain or shortness of breath. History reviewed. No pertinent past medical history.  There are no active problems to display for this patient.   Past Surgical History:  Procedure Laterality Date  . WISDOM TOOTH EXTRACTION          Home Medications    Prior to Admission medications   Medication Sig Start Date End Date Taking? Authorizing Provider  doxycycline (VIBRAMYCIN) 100 MG capsule Take 1 capsule (100 mg total) by mouth 2 (two) times daily. 08/26/17   Dorie Rank, MD  naproxen (NAPROSYN) 500 MG tablet Take 1 tablet (500 mg total) by mouth 2 (two) times daily. 08/26/17   Dorie Rank, MD    Family History Family History  Problem Relation Age of Onset  . Diabetes Other     Social History Social History   Tobacco Use  . Smoking status: Current Every Day Smoker    Packs/day: 0.50    Years: 10.00    Pack years: 5.00    Types: Cigarettes  . Smokeless tobacco: Never Used  Substance Use Topics  . Alcohol use: No  . Drug use: No     Allergies   Codeine and Penicillins   Review of Systems Review of Systems  All other systems reviewed and are negative.    Physical Exam Updated Vital Signs BP 131/82 (BP Location: Right Arm)   Pulse 61   Temp 97.9 F (36.6 C)  (Oral)   Resp 18   Ht 1.778 m (5\' 10" )   Wt 106.6 kg (235 lb)   SpO2 100%   BMI 33.72 kg/m   Physical Exam  Constitutional: He appears well-developed and well-nourished. No distress.  HENT:  Head: Normocephalic and atraumatic.  Right Ear: External ear normal.  Left Ear: External ear normal.  Eyes: Conjunctivae are normal. Right eye exhibits no discharge. Left eye exhibits no discharge. No scleral icterus.  Neck: Neck supple. No tracheal deviation present.  Cardiovascular: Normal rate.  Pulmonary/Chest: Effort normal. No stridor. No respiratory distress.  Abdominal: He exhibits no distension.  Musculoskeletal: He exhibits edema and tenderness.  Mild erythema and edema of the foot, more focused areas of erythema and small raised papules on the big toe and midfoot, no pustules, no drainage, no calf tenderness  Neurological: He is alert. Cranial nerve deficit: no gross deficits.  Skin: Skin is warm and dry. No rash noted.  Psychiatric: He has a normal mood and affect.  Nursing note and vitals reviewed.    ED Treatments / Results     Procedures Procedures (including critical care time)  Medications Ordered in ED Medications - No data to display   Initial Impression /  Assessment and Plan / ED Course  I have reviewed the triage vital signs and the nursing notes.  Pertinent labs & imaging results that were available during my care of the patient were reviewed by me and considered in my medical decision making (see chart for details).   Patient appears to have some type of either insect bite or sting.  Seems like he has some surrounding cellulitis now.  Plan on discharge home with prescription for doxycycline Naprosyn.  Monitor for fever or worsening symptoms.  Final Clinical Impressions(s) / ED Diagnoses   Final diagnoses:  Cellulitis of foot    ED Discharge Orders        Ordered    doxycycline (VIBRAMYCIN) 100 MG capsule  2 times daily     08/26/17 0743    naproxen  (NAPROSYN) 500 MG tablet  2 times daily     08/26/17 0743       Dorie Rank, MD 08/26/17 [phone]

## 2017-08-26 NOTE — ED Triage Notes (Signed)
Pt reports 2 days ago right foot was itching.  Yesterday started swelling and this morning woke up and was swollen, red, and painful.

## 2017-08-26 NOTE — Discharge Instructions (Signed)
Take over-the-counter antihistamines to help with itching, your symptoms should be improving over the next week, return for high fevers or other worrisome symptoms

## 2017-12-06 ENCOUNTER — Emergency Department (HOSPITAL_COMMUNITY)
Admission: EM | Admit: 2017-12-06 | Discharge: 2017-12-06 | Disposition: A | Discharge status: Home / Self Care | Payer: BLUE CROSS/BLUE SHIELD | Attending: Emergency Medicine | Admitting: Emergency Medicine

## 2017-12-06 ENCOUNTER — Encounter (HOSPITAL_COMMUNITY): Payer: Self-pay | Admitting: Emergency Medicine

## 2017-12-06 ENCOUNTER — Other Ambulatory Visit: Payer: Self-pay

## 2017-12-06 DIAGNOSIS — N341 Nonspecific urethritis: Secondary | ICD-10-CM | POA: Insufficient documentation

## 2017-12-06 DIAGNOSIS — N342 Other urethritis: Secondary | ICD-10-CM

## 2017-12-06 DIAGNOSIS — F17210 Nicotine dependence, cigarettes, uncomplicated: Secondary | ICD-10-CM | POA: Diagnosis not present

## 2017-12-06 DIAGNOSIS — R309 Painful micturition, unspecified: Secondary | ICD-10-CM | POA: Diagnosis present

## 2017-12-06 LAB — URINALYSIS, ROUTINE W REFLEX MICROSCOPIC
Bilirubin Urine: NEGATIVE
GLUCOSE, UA: NEGATIVE mg/dL
HGB URINE DIPSTICK: NEGATIVE
Ketones, ur: NEGATIVE mg/dL
Leukocytes, UA: NEGATIVE
Nitrite: NEGATIVE
PH: 6 (ref 5.0–8.0)
Protein, ur: NEGATIVE mg/dL
Specific Gravity, Urine: 1.024 (ref 1.005–1.030)

## 2017-12-06 MED ORDER — DOXYCYCLINE HYCLATE 100 MG PO CAPS: 100.0000 mg | ORAL_CAPSULE | Freq: Two times a day (BID) | ORAL | 0 refills | Status: DC

## 2017-12-06 NOTE — ED Notes (Signed)
PA at the bedside to assess.

## 2017-12-06 NOTE — ED Notes (Signed)
Instructed pt to take all of antibiotics as prescribed. 

## 2017-12-06 NOTE — ED Triage Notes (Signed)
Pt reports recent history of UTI's. Pt having burning and urgency for past two days.

## 2017-12-06 NOTE — ED Triage Notes (Signed)
Pt also requesting to be checked for STD.

## 2017-12-06 NOTE — Discharge Instructions (Addendum)
Take the antibiotic as directed until its finished.  You can follow-up with the health dept for further STD checks.

## 2017-12-07 LAB — GC/CHLAMYDIA PROBE AMP (~~LOC~~) NOT AT ARMC
Chlamydia: NEGATIVE
NEISSERIA GONORRHEA: NEGATIVE

## 2017-12-09 NOTE — ED Provider Notes (Signed)
[facility] EMERGENCY DEPARTMENT Provider Note   CSN: [id number] Arrival date & time: 12/06/17  1022     History   Chief Complaint Chief Complaint  Patient presents with  . Recurrent UTI    HPI Cameron Chavez is a 27 y.o. male.  HPI   XIONG HAIDAR is a 27 y.o. male who presents to the Emergency Department complaining of burning and urgency with urination for 2 days.  He reports history of previous UTIs and STDs.  He admits to recent unprotected intercourse.  He states his symptoms only occur with urination.  He has had no penile discharge or genital lesions.  He also denies any testicular pain or swelling.  No abdominal pain, fever, nausea or vomiting, or flank pain.   History reviewed. No pertinent past medical history.  There are no active problems to display for this patient.   Past Surgical History:  Procedure Laterality Date  . WISDOM TOOTH EXTRACTION          Home Medications    Prior to Admission medications   Medication Sig Start Date End Date Taking? Authorizing Provider  doxycycline (VIBRAMYCIN) 100 MG capsule Take 1 capsule (100 mg total) by mouth 2 (two) times daily. 12/06/17   Elgar Scoggins, PA-C  naproxen (NAPROSYN) 500 MG tablet Take 1 tablet (500 mg total) by mouth 2 (two) times daily. Patient not taking: Reported on 12/06/2017 08/26/17   Dorie Rank, MD    Family History Family History  Problem Relation Age of Onset  . Diabetes Other     Social History Social History   Tobacco Use  . Smoking status: Current Every Day Smoker    Packs/day: 0.50    Years: 10.00    Pack years: 5.00    Types: Cigarettes  . Smokeless tobacco: Never Used  Substance Use Topics  . Alcohol use: No  . Drug use: No     Allergies   Codeine and Penicillins   Review of Systems Review of Systems  Constitutional: Negative for activity change, appetite change, chills and fever.  Respiratory: Negative for chest tightness and shortness of breath.     Gastrointestinal: Negative for abdominal pain, nausea and vomiting.  Genitourinary: Positive for dysuria and urgency. Negative for decreased urine volume, difficulty urinating, discharge, flank pain, frequency, hematuria, penile pain, penile swelling and testicular pain.  Musculoskeletal: Negative for back pain.  Skin: Negative for rash.  Neurological: Negative for dizziness, weakness and numbness.  Hematological: Negative for adenopathy.  Psychiatric/Behavioral: Negative for confusion.  All other systems reviewed and are negative.    Physical Exam Updated Vital Signs BP 123/83 (BP Location: Right Arm)   Pulse 75   Temp 98.3 F (36.8 C) (Oral)   Resp 18   Ht 5\' 10"  (1.778 m)   Wt 113.4 kg   SpO2 98%   BMI 35.87 kg/m   Physical Exam  Constitutional: He appears well-developed. No distress.  HENT:  Head: Normocephalic.  Cardiovascular: Normal rate, regular rhythm and intact distal pulses.  No murmur heard. Pulmonary/Chest: Effort normal and breath sounds normal. No respiratory distress. He has no wheezes. He has no rales.  Abdominal: Soft. Normal appearance. He exhibits no distension and no mass. There is no hepatosplenomegaly. There is no tenderness. There is no rigidity, no rebound, no guarding, no CVA tenderness and no tenderness at McBurney's point.  No CVA tenderness  Genitourinary: Testes normal and penis normal. Cremasteric reflex is present. Right testis shows no mass and no tenderness. Left  testis shows no mass and no tenderness. Circumcised.  Genitourinary Comments: Normal-appearing external genitalia.  No rash.  No clear penile discharge.  Musculoskeletal: Normal range of motion. He exhibits no edema.  Neurological: He is alert. No sensory deficit.  Skin: Skin is warm and dry. No rash noted.  Psychiatric: He has a normal mood and affect.  Nursing note and vitals reviewed.    ED Treatments / Results  Labs (all labs ordered are listed, but only abnormal results  are displayed) Labs Reviewed  URINALYSIS, ROUTINE W REFLEX MICROSCOPIC  GC/CHLAMYDIA PROBE AMP ([place]) NOT AT [facility]    EKG None  Radiology No results found.  Procedures Procedures (including critical care time)  Medications Ordered in ED Medications - No data to display   Initial Impression / Assessment and Plan / ED Course  I have reviewed the triage vital signs and the nursing notes.  Pertinent labs & imaging results that were available during my care of the patient were reviewed by me and considered in my medical decision making (see chart for details).     Patient well-appearing.  Vitals reviewed.  No abdominal tenderness.  GC and Chlamydia culture pending.  Urinalysis reassuring.  Patient has penicillin allergy, so will treat with doxycycline.  Patient advised follow-up with health department for further STD evaluations if needed.  Final Clinical Impressions(s) / ED Diagnoses   Final diagnoses:  Urethritis    ED Discharge Orders         Ordered    doxycycline (VIBRAMYCIN) 100 MG capsule  2 times daily     12/06/17 1224           Kem Parkinson, PA-C 12/09/17 1452    Davonna Belling, MD 12/09/17 [phone]

## 2018-03-06 ENCOUNTER — Other Ambulatory Visit: Payer: Self-pay

## 2018-03-06 ENCOUNTER — Emergency Department (HOSPITAL_COMMUNITY)
Admission: EM | Admit: 2018-03-06 | Discharge: 2018-03-06 | Disposition: A | Discharge status: Home / Self Care | Payer: BLUE CROSS/BLUE SHIELD | Attending: Emergency Medicine | Admitting: Emergency Medicine

## 2018-03-06 ENCOUNTER — Encounter (HOSPITAL_COMMUNITY): Payer: Self-pay | Admitting: Emergency Medicine

## 2018-03-06 DIAGNOSIS — Z711 Person with feared health complaint in whom no diagnosis is made: Secondary | ICD-10-CM

## 2018-03-06 DIAGNOSIS — F17210 Nicotine dependence, cigarettes, uncomplicated: Secondary | ICD-10-CM | POA: Insufficient documentation

## 2018-03-06 DIAGNOSIS — Z202 Contact with and (suspected) exposure to infections with a predominantly sexual mode of transmission: Secondary | ICD-10-CM | POA: Diagnosis present

## 2018-03-06 NOTE — ED Provider Notes (Signed)
[facility] EMERGENCY DEPARTMENT Provider Note   CSN: [id number] Arrival date & time: 03/06/18  1701     History   Chief Complaint Chief Complaint  Patient presents with  . SEXUALLY TRANSMITTED DISEASE    HPI FINNEAS MATHE is a 27 y.o. male who presents for a request of STD testing. He states that he was here in August for dysuria. He was tested and treated at that time. UA and G&C were negative. He states that he had jock itch and saw his doctor for his in the interim. This has cleared up. He states he is here today because his anxiety was driving him to have a full STD screen. He has not had sex since August. He does not have any penis or testicular pain or discharge or dysuria. He just wants HIV and syphilis testing and declines being tested again for gonorrhea or chlamydia.   HPI  History reviewed. No pertinent past medical history.  There are no active problems to display for this patient.   Past Surgical History:  Procedure Laterality Date  . WISDOM TOOTH EXTRACTION          Home Medications    Prior to Admission medications   Medication Sig Start Date End Date Taking? Authorizing Provider  doxycycline (VIBRAMYCIN) 100 MG capsule Take 1 capsule (100 mg total) by mouth 2 (two) times daily. 12/06/17   Triplett, Tammy, PA-C  naproxen (NAPROSYN) 500 MG tablet Take 1 tablet (500 mg total) by mouth 2 (two) times daily. Patient not taking: Reported on 12/06/2017 08/26/17   Dorie Rank, MD    Family History Family History  Problem Relation Age of Onset  . Diabetes Other     Social History Social History   Tobacco Use  . Smoking status: Current Every Day Smoker    Packs/day: 0.50    Years: 10.00    Pack years: 5.00    Types: Cigarettes  . Smokeless tobacco: Never Used  Substance Use Topics  . Alcohol use: No  . Drug use: No     Allergies   Codeine and Penicillins   Review of Systems Review of Systems  Constitutional: Negative for fever.  Genitourinary:  Negative for discharge, penile pain, penile swelling, scrotal swelling and testicular pain.     Physical Exam Updated Vital Signs BP (!) 146/82 (BP Location: Right Arm)   Pulse 78   Temp 99 F (37.2 C) (Tympanic)   Resp 18   Ht 5\' 10"  (1.778 m)   Wt 112.5 kg   SpO2 98%   BMI 35.58 kg/m   Physical Exam  Constitutional: He is oriented to person, place, and time. He appears well-developed and well-nourished. No distress.  HENT:  Head: Normocephalic and atraumatic.  Eyes: Pupils are equal, round, and reactive to light. Conjunctivae are normal. Right eye exhibits no discharge. Left eye exhibits no discharge. No scleral icterus.  Neck: Normal range of motion.  Cardiovascular: Normal rate.  Pulmonary/Chest: Effort normal. No respiratory distress.  Abdominal: He exhibits no distension.  Neurological: He is alert and oriented to person, place, and time.  Skin: Skin is warm and dry.  Psychiatric: He has a normal mood and affect. His behavior is normal.  Nursing note and vitals reviewed.    ED Treatments / Results  Labs (all labs ordered are listed, but only abnormal results are displayed) Labs Reviewed - No data to display  EKG None  Radiology No results found.  Procedures Procedures (including critical care time)  Medications Ordered in ED Medications - No data to display   Initial Impression / Assessment and Plan / ED Course  I have reviewed the triage vital signs and the nursing notes.  Pertinent labs & imaging results that were available during my care of the patient were reviewed by me and considered in my medical decision making (see chart for details).  27 year old presents for request of HIV and RPR testing. He denies any symptoms. He declines being retested for G&C. He was advised results will be available on Mychart in several days. He verbalized understanding.  Final Clinical Impressions(s) / ED Diagnoses   Final diagnoses:  Concern about STD in male  without diagnosis    ED Discharge Orders    None       Recardo Evangelist, PA-C 03/06/18 [place], [place], DO 03/10/18 [phone]

## 2018-03-06 NOTE — ED Triage Notes (Signed)
Pt wishes to be checked for STDs. Denies any sx or known infected partners. No sexual intercourse in 3 months.

## 2018-03-06 NOTE — Discharge Instructions (Addendum)
Please check your MyChart for results in 2-3 days Follow up with health dept

## 2018-03-07 LAB — RPR: RPR: NONREACTIVE

## 2018-03-07 LAB — HIV ANTIBODY (ROUTINE TESTING W REFLEX): HIV Screen 4th Generation wRfx: NONREACTIVE

## 2018-03-23 ENCOUNTER — Emergency Department (HOSPITAL_COMMUNITY)
Admission: EM | Admit: 2018-03-23 | Discharge: 2018-03-23 | Disposition: A | Discharge status: Home / Self Care | Payer: BLUE CROSS/BLUE SHIELD | Attending: Emergency Medicine | Admitting: Emergency Medicine

## 2018-03-23 ENCOUNTER — Other Ambulatory Visit: Payer: Self-pay

## 2018-03-23 ENCOUNTER — Encounter (HOSPITAL_COMMUNITY): Payer: Self-pay

## 2018-03-23 DIAGNOSIS — F17210 Nicotine dependence, cigarettes, uncomplicated: Secondary | ICD-10-CM | POA: Insufficient documentation

## 2018-03-23 DIAGNOSIS — F418 Other specified anxiety disorders: Secondary | ICD-10-CM | POA: Diagnosis not present

## 2018-03-23 DIAGNOSIS — N4889 Other specified disorders of penis: Secondary | ICD-10-CM | POA: Diagnosis present

## 2018-03-23 NOTE — ED Provider Notes (Signed)
[facility] EMERGENCY DEPARTMENT Provider Note   CSN: [id number] Arrival date & time: 03/23/18  0006  Time seen 12:45 AM   History   Chief Complaint Chief Complaint  Patient presents with  . Testicle Pain    HPI Cameron Chavez is a 27 y.o. male.  HPI   patient states for the last 2 days he has had a burning and aching in his penis that comes and goes and last about 10 to 15 minutes and happens maybe once or twice an hour.  He does not relate any discomfort to urination.  He denies any penile drip.  He states he is extremely anxious and when I asked him if his penis has any change of color or redness or rash he states "I cannot remember what is normal because I am so anxious".  He states he did have a jock itch on his scrotum and his thighs about 2 weeks ago and he has been using nystatin powder on it.  He states he thinks he was putting it on his penis and his primary care doctor told him he had urethritis from using the powder and to stop putting it on his penis.  He states the rash is getting better.  He states he noticed a small red raised area that is now just shiny.  He states he noted it after masturbating.  He states he had unprotected sexual encounter about 4 months ago and since then he has been anxious about getting STDs and has been tested several times.  He denies any nausea or vomiting.  He states he does not wear underwear and he wears jeans type material.  Please note patient denies any complaints of testicular pain, he states he did not say that that he said he was having a groin problem.  PCP Monico Blitz, MD      History reviewed. No pertinent past medical history.  There are no active problems to display for this patient.   Past Surgical History:  Procedure Laterality Date  . WISDOM TOOTH EXTRACTION          Home Medications    Prior to Admission medications   Not on File    Family History Family History  Problem Relation Age of Onset  . Diabetes  Other     Social History Social History   Tobacco Use  . Smoking status: Current Every Day Smoker    Packs/day: 0.50    Years: 10.00    Pack years: 5.00    Types: Cigarettes  . Smokeless tobacco: Never Used  Substance Use Topics  . Alcohol use: No  . Drug use: No     Allergies   Codeine and Penicillins   Review of Systems Review of Systems  All other systems reviewed and are negative.    Physical Exam Updated Vital Signs BP 139/90 (BP Location: Right Arm)   Pulse 68   Temp 97.9 F (36.6 C) (Oral)   Resp 18   Ht 5\' 10"  (1.778 m)   Wt 112.5 kg   SpO2 98%   BMI 35.58 kg/m   Vital signs normal    Physical Exam  Constitutional: He is oriented to person, place, and time. He appears well-developed and well-nourished.  HENT:  Head: Normocephalic and atraumatic.  Right Ear: External ear normal.  Left Ear: External ear normal.  Eyes: Conjunctivae and EOM are normal.  Neck: Normal range of motion.  Cardiovascular: Normal rate.  Pulmonary/Chest: Effort normal. No respiratory distress.  Genitourinary: Penis normal.  Genitourinary Comments: When I inspect his groin and look at his thigh in his intertriginous area there is no rash seen.  He has some minor change of color on the left that is consistent with a healing tenia infection.  His penis is normal.  When I ask him what looks different he points in a general area which is where his vein is seen under the skin on the dorsum of his penis.  When I asked him to show me where the red dot is it is so small it is hard to see.  It is also on the dorsum of his penis.  Chaperone was present.  Neurological: He is alert and oriented to person, place, and time. No cranial nerve deficit.  Skin: Skin is warm and dry. No rash noted. No erythema.  Psychiatric: His behavior is normal. Thought content normal. His mood appears anxious.  Nursing note and vitals reviewed.    ED Treatments / Results  Labs (all labs ordered are  listed, but only abnormal results are displayed) Labs Reviewed - No data to display  EKG None  Radiology No results found.  Procedures Procedures (including critical care time)  Medications Ordered in ED Medications - No data to display   Initial Impression / Assessment and Plan / ED Course  I have reviewed the triage vital signs and the nursing notes.  Pertinent labs & imaging results that were available during my care of the patient were reviewed by me and considered in my medical decision making (see chart for details).     Patient was advised his penis appeared to be normal.  Patient was advised he should wear white cotton underwear.  Further STD testing was not felt needed.  Final Clinical Impressions(s) / ED Diagnoses   Final diagnoses:  Anxiety about health    ED Discharge Orders    None     Plan discharge  Rolland Porter, MD, Barbette Or, MD 03/23/18 [phone]

## 2018-03-23 NOTE — Discharge Instructions (Addendum)
You still need to continue the athlete's foot powder on your left thigh. You should wear white cotton underwear.

## 2018-03-23 NOTE — ED Triage Notes (Signed)
Pt sates that he has been having testicle pain that started a couple days ago. Says he comes and goes. Wanted to go see PCP but was too anxious and decided to come tonight. Says hes been using power for jock itch. Heat helps with pain.

## 2018-04-08 ENCOUNTER — Emergency Department (HOSPITAL_COMMUNITY)
Admission: EM | Admit: 2018-04-08 | Discharge: 2018-04-09 | Disposition: A | Discharge status: Home / Self Care | Payer: BLUE CROSS/BLUE SHIELD | Attending: Emergency Medicine | Admitting: Emergency Medicine

## 2018-04-08 ENCOUNTER — Other Ambulatory Visit: Payer: Self-pay

## 2018-04-08 ENCOUNTER — Encounter (HOSPITAL_COMMUNITY): Payer: Self-pay | Admitting: Emergency Medicine

## 2018-04-08 DIAGNOSIS — Z79899 Other long term (current) drug therapy: Secondary | ICD-10-CM | POA: Insufficient documentation

## 2018-04-08 DIAGNOSIS — F418 Other specified anxiety disorders: Secondary | ICD-10-CM

## 2018-04-08 DIAGNOSIS — F17210 Nicotine dependence, cigarettes, uncomplicated: Secondary | ICD-10-CM | POA: Insufficient documentation

## 2018-04-08 DIAGNOSIS — F419 Anxiety disorder, unspecified: Secondary | ICD-10-CM | POA: Insufficient documentation

## 2018-04-08 NOTE — ED Triage Notes (Signed)
Pt c/o pain to the tip of his penis x 1 week, pt also reports "stringly brown mucus" with urination that started tonight but reports "cloudy" urine this week as well, pt was seen at urgent care and was told he did not have a UTI, pt states he has not been sexually active for 4 mos and since that time he has been tested for STDs 4 times and all tests have been clear, pt states he was told by [facility] 2-3 weeks ago he could have trichomonas and was given 5 antibiotics for treatment

## 2018-04-08 NOTE — ED Provider Notes (Signed)
[facility] EMERGENCY DEPARTMENT Provider Note   CSN: [id number] Arrival date & time: 04/08/18  2133  Time seen 23:35 PM    History   Chief Complaint Chief Complaint  Patient presents with  . Penis Pain    HPI Cameron Chavez is a 27 y.o. male.  HPI this is this patient's fourth ED visit was visits to other facilities since he had unprotected sex about 4 months ago.  He has been tested multiple times for STDs that have been negative.  He states 30 minutes prior to arrival when he urinated he saw some brown mucus floating in the water.  He states a week ago he has tingling in the tip of his penis that he relates it as to getting worse mainly after masturbation.  He has masturbated twice today.  He states when he urinated he saw white stuff in the urine.  He denies dysuria but has had frequency since December 2.  He denies any fever.  I asked patient what his main concern is because of all of these visits related to the same issue.  He states he is afraid he has STD, however I pointed out to him he has been tested several times and has been negative.  He then is worried he has some wrong with his prostate.  His last visit I told him to follow-up with urology however he states he has lost his health insurance since then.  PCP Monico Blitz, MD   History reviewed. No pertinent past medical history.  There are no active problems to display for this patient.   Past Surgical History:  Procedure Laterality Date  . WISDOM TOOTH EXTRACTION          Home Medications    Prior to Admission medications   Medication Sig Start Date End Date Taking? Authorizing Provider  Aspirin-Acetaminophen-Caffeine (GOODY HEADACHE PO) Take 1 packet by mouth daily as needed (for pain).   Yes [provider]  clindamycin (CLEOCIN) 300 MG capsule Take 300 mg by mouth every 8 (eight) hours. 7 day course starting on 04/04/2018   Yes [provider]    Family History Family History    Problem Relation Age of Onset  . Diabetes Other     Social History Social History   Tobacco Use  . Smoking status: Current Every Day Smoker    Packs/day: 0.50    Years: 10.00    Pack years: 5.00    Types: Cigarettes  . Smokeless tobacco: Never Used  Substance Use Topics  . Alcohol use: No  . Drug use: No     Allergies   Codeine and Penicillins   Review of Systems Review of Systems  All other systems reviewed and are negative.    Physical Exam Updated Vital Signs BP (!) 155/93 (BP Location: Right Arm)   Pulse 72   Temp 98.2 F (36.8 C) (Oral)   Resp 17   Ht 5\' 10"  (1.778 m)   Wt 111.1 kg   SpO2 98%   BMI 35.15 kg/m   Vital signs normal except hypertension   Physical Exam  Constitutional: He is oriented to person, place, and time. He appears well-developed and well-nourished. No distress.  HENT:  Head: Normocephalic and atraumatic.  Right Ear: External ear normal.  Left Ear: External ear normal.  Nose: Nose normal.  Eyes: Conjunctivae and EOM are normal.  Neck: Normal range of motion.  Cardiovascular: Normal rate.  Pulmonary/Chest: Effort normal. No respiratory distress.  Genitourinary: Penis  normal.  Genitourinary Comments: There is no rash in his groin, his scrotum appears normal, the tinea crura's he had before appears to be gone.  His penis appears normal, there is no drainage in the urethra, there is no abnormality seen.  Musculoskeletal: Normal range of motion.  Neurological: He is alert and oriented to person, place, and time. No cranial nerve deficit.  Skin: Skin is warm and dry.  Psychiatric: His mood appears anxious. His speech is rapid and/or pressured.  Nursing note and vitals reviewed.    ED Treatments / Results  Labs (all labs ordered are listed, but only abnormal results are displayed) Results for orders placed or performed during the hospital encounter of 04/08/18  Urinalysis, Routine w reflex microscopic  Result Value Ref Range    Color, Urine YELLOW YELLOW   APPearance HAZY (A) CLEAR   Specific Gravity, Urine 1.026 1.005 - 1.030   pH 6.0 5.0 - 8.0   Glucose, UA NEGATIVE NEGATIVE mg/dL   Hgb urine dipstick NEGATIVE NEGATIVE   Bilirubin Urine NEGATIVE NEGATIVE   Ketones, ur NEGATIVE NEGATIVE mg/dL   Protein, ur NEGATIVE NEGATIVE mg/dL   Nitrite NEGATIVE NEGATIVE   Leukocytes, UA NEGATIVE NEGATIVE   Laboratory interpretation all normal     EKG None  Radiology No results found.  Procedures Procedures (including critical care time)  Medications Ordered in ED Medications - No data to display   Initial Impression / Assessment and Plan / ED Course  I have reviewed the triage vital signs and the nursing notes.  Pertinent labs & imaging results that were available during my care of the patient were reviewed by me and considered in my medical decision making (see chart for details).     Urinalysis was done again.  Patient has been tested for STDs several times.  This was not done again.  I tried to get his thoughts about what he is most anxious about and is hard to pin him down.  I suspect he will be to the ED multiple more times for the same issue.  Final Clinical Impressions(s) / ED Diagnoses   Final diagnoses:  Anxiety about health    ED Discharge Orders    None      Plan discharge  Rolland Porter, MD, Barbette Or, MD 04/09/18 [phone]

## 2018-04-09 LAB — URINALYSIS, ROUTINE W REFLEX MICROSCOPIC
BILIRUBIN URINE: NEGATIVE
Glucose, UA: NEGATIVE mg/dL
Hgb urine dipstick: NEGATIVE
Ketones, ur: NEGATIVE mg/dL
Leukocytes, UA: NEGATIVE
NITRITE: NEGATIVE
PH: 6 (ref 5.0–8.0)
PROTEIN: NEGATIVE mg/dL
Specific Gravity, Urine: 1.026 (ref 1.005–1.030)

## 2018-04-09 NOTE — Discharge Instructions (Addendum)
Your urine test is normal.  I would suspect that after masturbation you would see some debris when you urinate.

## 2018-07-04 ENCOUNTER — Emergency Department (HOSPITAL_COMMUNITY): Admission: EM | Admit: 2018-07-04 | Discharge: 2018-07-04 | Discharge status: Against Medical Advice | Payer: Self-pay

## 2018-07-04 NOTE — ED Notes (Signed)
Called no answer

## 2018-08-04 ENCOUNTER — Telehealth: Payer: Self-pay | Admitting: Gastroenterology

## 2018-08-04 DIAGNOSIS — R059 Cough, unspecified: Secondary | ICD-10-CM

## 2018-08-04 DIAGNOSIS — R05 Cough: Secondary | ICD-10-CM

## 2018-08-04 MED ORDER — BENZONATATE 100 MG PO CAPS: ORAL_CAPSULE | ORAL | 0 refills | Status: DC

## 2018-08-04 NOTE — Progress Notes (Signed)
E-Visit for Corona Virus Screening  Based on your current symptoms, it seems unlikely that your symptoms are related to the Coronavirus.   Coronavirus disease 2019 (COVID-19) is a respiratory illness that can spread from person to person. The virus that causes COVID-19 is a new virus that was first identified in the country of [place] but is now found in multiple other countries and has spread to the [place].  Symptoms associated with the virus are mild to severe fever, cough, and shortness of breath. There is currently no vaccine to protect against COVID-19, and there is no specific antiviral treatment for the virus.   To be considered HIGH RISK for Coronavirus (COVID-19), you have to meet the following criteria:  . Traveled to [place], [place], [place], [place] or [place]; or in the [place] to [place], [place], [place], or [place]; and have fever, cough, and shortness of breath within the last 2 weeks of travel OR  . Been in close contact with a person diagnosed with COVID-19 within the last 2 weeks and have fever, cough, and shortness of breath  . IF YOU DO NOT MEET THESE CRITERIA, YOU ARE CONSIDERED LOW RISK FOR COVID-19.   It is vitally important that if you feel that you have an infection such as this virus or any other virus that you stay home and away from places where you may spread it to others.  You should self-quarantine for 14 days if you have symptoms that could potentially be coronavirus and avoid contact with people age 53 and older.   You can use medication such as A prescription cough medication called Tessalon Perles 100 mg. You may take 1-2 capsules every 8 hours as needed for cough  You may also take acetaminophen (Tylenol) as needed for fever.   Reduce your risk of any infection by using the same precautions used for avoiding the common cold or flu:  Marland Kitchen Wash your hands often with soap and warm water for at least 20 seconds.  If soap and water are not readily  available, use an alcohol-based hand sanitizer with at least 60% alcohol.  . If coughing or sneezing, cover your mouth and nose by coughing or sneezing into the elbow areas of your shirt or coat, into a tissue or into your sleeve (not your hands). . Avoid shaking hands with others and consider head nods or verbal greetings only. . Avoid touching your eyes, nose, or mouth with unwashed hands.  . Avoid close contact with people who are sick. . Avoid places or events with large numbers of people in one location, like concerts or sporting events. . Carefully consider travel plans you have or are making. . If you are planning any travel outside or inside the [place], visit the CDC's Travelers' Health webpage for the latest health notices. . If you have some symptoms but not all symptoms, continue to monitor at home and seek medical attention if your symptoms worsen. . If you are having a medical emergency, call 911.  HOME CARE . Only take medications as instructed by your medical team. . Drink plenty of fluids and get plenty of rest. . A steam or ultrasonic humidifier can help if you have congestion.   GET HELP RIGHT AWAY IF: . You develop worsening fever. . You become short of breath . You cough up blood. . Your symptoms become more severe MAKE SURE YOU   Understand these instructions.  Will watch your condition.  Will get help  right away if you are not doing well or get worse.  Your e-visit answers were reviewed by a board certified advanced clinical practitioner to complete your personal care plan.  Depending on the condition, your plan could have included both over the counter or prescription medications.  If there is a problem please reply once you have received a response from your provider. Your safety is important to [place].  If you have drug allergies check your prescription carefully.    You can use MyChart to ask questions about today's visit, request a non-urgent call back, or ask for a  work or school excuse for 24 hours related to this e-Visit. If it has been greater than 24 hours you will need to follow up with your provider, or enter a new e-Visit to address those concerns. You will get an e-mail in the next two days asking about your experience.  I hope that your e-visit has been valuable and will speed your recovery. Thank you for using e-visits.

## 2018-08-15 ENCOUNTER — Ambulatory Visit: Payer: Self-pay | Admitting: Urology

## 2018-09-30 ENCOUNTER — Emergency Department (HOSPITAL_COMMUNITY)
Admission: EM | Admit: 2018-09-30 | Discharge: 2018-09-30 | Disposition: A | Discharge status: Home / Self Care | Payer: Self-pay | Attending: Emergency Medicine | Admitting: Emergency Medicine

## 2018-09-30 ENCOUNTER — Encounter (HOSPITAL_COMMUNITY): Payer: Self-pay | Admitting: *Deleted

## 2018-09-30 ENCOUNTER — Other Ambulatory Visit: Payer: Self-pay

## 2018-09-30 DIAGNOSIS — F17210 Nicotine dependence, cigarettes, uncomplicated: Secondary | ICD-10-CM | POA: Insufficient documentation

## 2018-09-30 DIAGNOSIS — K0889 Other specified disorders of teeth and supporting structures: Secondary | ICD-10-CM | POA: Insufficient documentation

## 2018-09-30 DIAGNOSIS — K029 Dental caries, unspecified: Secondary | ICD-10-CM | POA: Insufficient documentation

## 2018-09-30 MED ORDER — CLINDAMYCIN HCL 150 MG PO CAPS: ORAL_CAPSULE | ORAL | 0 refills | Status: DC

## 2018-09-30 MED ORDER — NAPROXEN 250 MG PO TABS: 250.0000 mg | ORAL_TABLET | Freq: Two times a day (BID) | ORAL | 0 refills | Status: DC | PRN

## 2018-09-30 NOTE — ED Provider Notes (Signed)
[facility] EMERGENCY DEPARTMENT Provider Note   CSN: [id number] Arrival date & time: 09/30/18  0998    History   Chief Complaint Chief Complaint  Patient presents with   Dental Pain    HPI Cameron Chavez is a 28 y.o. male.     HPI  Pt was seen at 0945. Per pt, c/o gradual onset and persistence of constant right upper tooth "pain" for the past 3 days. Pt states he "broke the tooth" "a while ago."  Denies fevers, no intra-oral edema, no rash, no facial swelling, no dysphagia, no neck pain.   The condition is aggravated by nothing. The condition is relieved by nothing. The symptoms have been associated with no other complaints. The patient has no significant history of serious medical conditions.    History reviewed. No pertinent past medical history.  There are no active problems to display for this patient.   Past Surgical History:  Procedure Laterality Date   WISDOM TOOTH EXTRACTION          Home Medications    Prior to Admission medications   Medication Sig Start Date End Date Taking? Authorizing Provider  benzonatate (TESSALON PERLES) 100 MG capsule Take 1-2 by mouth every 8 hours as needed for cough 08/04/18   Mahala Menghini, PA-C  clindamycin (CLEOCIN) 150 MG capsule 3 tabs PO TID x 10 days 09/30/18   Francine Graven, DO  naproxen (NAPROSYN) 250 MG tablet Take 1 tablet (250 mg total) by mouth 2 (two) times daily as needed for mild pain or moderate pain (take with food). 09/30/18   Francine Graven, DO    Family History Family History  Problem Relation Age of Onset   Diabetes Other     Social History Social History   Tobacco Use   Smoking status: Current Every Day Smoker    Packs/day: 0.50    Years: 10.00    Pack years: 5.00    Types: Cigarettes   Smokeless tobacco: Never Used  Substance Use Topics   Alcohol use: No   Drug use: No     Allergies   Codeine and Penicillins   Review of Systems Review of Systems ROS: Statement: All  systems negative except as marked or noted in the HPI; Constitutional: Negative for fever and chills. ; ; Eyes: Negative for eye pain and discharge. ; ; ENMT: Positive for dental caries, dental hygiene poor and toothache. Negative for ear pain, bleeding gums, dental injury, facial deformity, facial swelling, hoarseness, nasal congestion, sinus pressure, sore throat, throat swelling and tongue swollen. ; ; Cardiovascular: Negative for chest pain, palpitations, diaphoresis, dyspnea and peripheral edema. ; ; Respiratory: Negative for cough, wheezing and stridor. ; ; Gastrointestinal: Negative for nausea, vomiting, diarrhea and abdominal pain. ; ; Genitourinary: Negative for dysuria, flank pain and hematuria. ; ; Musculoskeletal: Negative for back pain and neck pain. ; ; Skin: Negative for rash and skin lesion. ; ; Neuro: Negative for headache, lightheadedness and neck stiffness. ;    Physical Exam Updated Vital Signs BP (!) 141/85 (BP Location: Right Arm)    Pulse (!) 49    Temp 98.1 F (36.7 C) (Oral)    Resp 15    Ht 5\' 10"  (1.778 m)    Wt 106.6 kg    SpO2 99%    BMI 33.72 kg/m   Physical Exam 0950: Physical examination: Vital signs and O2 SAT: Reviewed; Constitutional: Well developed, Well nourished, Well hydrated, In no acute distress; Head and Face: Normocephalic, Atraumatic;  Eyes: EOMI, PERRL, No scleral icterus; ENMT: Mouth and pharynx normal, Poor dentition, Widespread dental decay, Left TM normal, Right TM normal, Mucous membranes moist, +upper right 1st premolar with dental decay.  No gingival erythema, edema, fluctuance, or drainage.  No intra-oral edema. No submandibular or sublingual edema. No hoarse voice, no drooling, no stridor. No trismus. ; Neck: Supple, Full range of motion, No lymphadenopathy; Cardiovascular: Regular rate and rhythm, No gallop; Respiratory: Breath sounds clear & equal bilaterally, No wheezes, Normal respiratory effort/excursion; Chest: Nontender, Movement normal;  Extremities: Pulses normal, No tenderness, No edema; Neuro: AA&Ox3, Major CN grossly intact.  No gross focal motor or sensory deficits in extremities.; Skin: Color normal, No rash, No petechiae, Warm, Dry   ED Treatments / Results  Labs (all labs ordered are listed, but only abnormal results are displayed)   EKG None  Radiology   Procedures Procedures (including critical care time)  Medications Ordered in ED Medications - No data to display   Initial Impression / Assessment and Plan / ED Course  I have reviewed the triage vital signs and the nursing notes.  Pertinent labs & imaging results that were available during my care of the patient were reviewed by me and considered in my medical decision making (see chart for details).     MDM Reviewed: previous chart, nursing note and vitals     0950:  Pt encouraged to f/u with dentist or oral surgeon for his dental needs for good continuity of care and definitive treatment.  Pt verb understanding.     Final Clinical Impressions(s) / ED Diagnoses   Final diagnoses:  Pain, dental  Dental caries    ED Discharge Orders         Ordered    clindamycin (CLEOCIN) 150 MG capsule     09/30/18 0949    naproxen (NAPROSYN) 250 MG tablet  2 times daily PRN     09/30/18 Opal, Weston, DO 10/04/18 1739

## 2018-09-30 NOTE — Discharge Instructions (Signed)
Take the prescriptions as directed.  Call your regular Dentist tomorrow to schedule a follow up appointment within the week.  Return to the Emergency Department immediately sooner if worsening.

## 2018-09-30 NOTE — ED Triage Notes (Signed)
Patient presents to the ED with upper top right tooth pain since Friday night.

## 2018-11-07 ENCOUNTER — Other Ambulatory Visit: Payer: Self-pay

## 2018-11-07 ENCOUNTER — Encounter (HOSPITAL_COMMUNITY): Payer: Self-pay | Admitting: Emergency Medicine

## 2018-11-07 ENCOUNTER — Emergency Department (HOSPITAL_COMMUNITY)
Admission: EM | Admit: 2018-11-07 | Discharge: 2018-11-07 | Disposition: A | Discharge status: Home / Self Care | Payer: Self-pay | Attending: Emergency Medicine | Admitting: Emergency Medicine

## 2018-11-07 ENCOUNTER — Emergency Department (HOSPITAL_COMMUNITY)
Admission: EM | Admit: 2018-11-07 | Discharge: 2018-11-08 | Disposition: A | Discharge status: Home / Self Care | Payer: Self-pay | Attending: Emergency Medicine | Admitting: Emergency Medicine

## 2018-11-07 DIAGNOSIS — Z5321 Procedure and treatment not carried out due to patient leaving prior to being seen by health care provider: Secondary | ICD-10-CM | POA: Insufficient documentation

## 2018-11-07 DIAGNOSIS — K047 Periapical abscess without sinus: Secondary | ICD-10-CM | POA: Insufficient documentation

## 2018-11-07 DIAGNOSIS — Z79899 Other long term (current) drug therapy: Secondary | ICD-10-CM | POA: Insufficient documentation

## 2018-11-07 DIAGNOSIS — F17210 Nicotine dependence, cigarettes, uncomplicated: Secondary | ICD-10-CM | POA: Insufficient documentation

## 2018-11-07 NOTE — ED Triage Notes (Signed)
Rounding completed in waiting area. Patient in NAD. Delay explained.

## 2018-11-07 NOTE — ED Notes (Signed)
Patient not present in waiting area.

## 2018-11-07 NOTE — ED Notes (Signed)
Patient not in waiting area or outside.

## 2018-11-07 NOTE — ED Triage Notes (Signed)
Patient reports facial swelling that started yesterday. Has had a tooth broken off at the gum x 1 year.

## 2018-11-07 NOTE — ED Notes (Signed)
Patient called for room. No answer. ?

## 2018-11-08 ENCOUNTER — Emergency Department (HOSPITAL_COMMUNITY): Payer: Self-pay

## 2018-11-08 ENCOUNTER — Other Ambulatory Visit: Payer: Self-pay

## 2018-11-08 ENCOUNTER — Encounter (HOSPITAL_COMMUNITY): Payer: Self-pay | Admitting: Emergency Medicine

## 2018-11-08 ENCOUNTER — Emergency Department (HOSPITAL_COMMUNITY)
Admission: EM | Admit: 2018-11-08 | Discharge: 2018-11-08 | Disposition: A | Discharge status: Home / Self Care | Payer: Self-pay | Attending: Emergency Medicine | Admitting: Emergency Medicine

## 2018-11-08 DIAGNOSIS — F17210 Nicotine dependence, cigarettes, uncomplicated: Secondary | ICD-10-CM | POA: Insufficient documentation

## 2018-11-08 DIAGNOSIS — K047 Periapical abscess without sinus: Secondary | ICD-10-CM | POA: Insufficient documentation

## 2018-11-08 DIAGNOSIS — Z79899 Other long term (current) drug therapy: Secondary | ICD-10-CM | POA: Insufficient documentation

## 2018-11-08 LAB — CBC WITH DIFFERENTIAL/PLATELET
Abs Immature Granulocytes: 0.08 10*3/uL — ABNORMAL HIGH (ref 0.00–0.07)
Basophils Absolute: 0.1 10*3/uL (ref 0.0–0.1)
Basophils Relative: 0 %
Eosinophils Absolute: 0.3 10*3/uL (ref 0.0–0.5)
Eosinophils Relative: 1 %
HCT: 44.7 % (ref 39.0–52.0)
Hemoglobin: 15.2 g/dL (ref 13.0–17.0)
Immature Granulocytes: 0 %
Lymphocytes Relative: 12 %
Lymphs Abs: 2.4 10*3/uL (ref 0.7–4.0)
MCH: 31.2 pg (ref 26.0–34.0)
MCHC: 34 g/dL (ref 30.0–36.0)
MCV: 91.8 fL (ref 80.0–100.0)
Monocytes Absolute: 1.3 10*3/uL — ABNORMAL HIGH (ref 0.1–1.0)
Monocytes Relative: 7 %
Neutro Abs: 15.3 10*3/uL — ABNORMAL HIGH (ref 1.7–7.7)
Neutrophils Relative %: 80 %
Platelets: 282 10*3/uL (ref 150–400)
RBC: 4.87 MIL/uL (ref 4.22–5.81)
RDW: 12.5 % (ref 11.5–15.5)
WBC: 19.3 10*3/uL — ABNORMAL HIGH (ref 4.0–10.5)
nRBC: 0 % (ref 0.0–0.2)

## 2018-11-08 LAB — BASIC METABOLIC PANEL
Anion gap: 10 (ref 5–15)
BUN: 10 mg/dL (ref 6–20)
CO2: 22 mmol/L (ref 22–32)
Calcium: 9 mg/dL (ref 8.9–10.3)
Chloride: 106 mmol/L (ref 98–111)
Creatinine, Ser: 0.82 mg/dL (ref 0.61–1.24)
GFR calc Af Amer: >60 mL/min (ref >60)
GFR calc non Af Amer: >60 mL/min (ref >60)
Glucose, Bld: 111 mg/dL — ABNORMAL HIGH (ref 70–99)
Potassium: 3.2 mmol/L — ABNORMAL LOW (ref 3.5–5.1)
Sodium: 138 mmol/L (ref 135–145)

## 2018-11-08 MED ORDER — CLINDAMYCIN PHOSPHATE 900 MG/6ML IJ SOLN
600.0000 mg | Freq: Once | INTRAMUSCULAR | Status: AC
  Administered 2018-11-08: 600 mg via INTRAMUSCULAR

## 2018-11-08 MED ORDER — IOHEXOL 300 MG/ML  SOLN
75.0000 mL | Freq: Once | INTRAMUSCULAR | Status: AC | PRN
  Administered 2018-11-08: 75 mL via INTRAVENOUS

## 2018-11-08 MED ORDER — OXYCODONE-ACETAMINOPHEN 5-325 MG PO TABS
1.0000 | ORAL_TABLET | Freq: Once | ORAL | Status: AC
  Administered 2018-11-08: 1 via ORAL
  Filled 2018-11-08: qty 1

## 2018-11-08 MED ORDER — IBUPROFEN 800 MG PO TABS: 800.0000 mg | ORAL_TABLET | Freq: Four times a day (QID) | ORAL | 0 refills | Status: AC | PRN

## 2018-11-08 MED ORDER — CLINDAMYCIN HCL 150 MG PO CAPS: 300.0000 mg | ORAL_CAPSULE | Freq: Four times a day (QID) | ORAL | 0 refills | Status: DC

## 2018-11-08 MED ORDER — CLINDAMYCIN PHOSPHATE 300 MG/2ML IJ SOLN
INTRAMUSCULAR | Status: AC
  Filled 2018-11-08: qty 4

## 2018-11-08 NOTE — ED Provider Notes (Signed)
[facility] EMERGENCY DEPARTMENT Provider Note   CSN: [id number] Arrival date & time: 11/07/18  2350     History   Chief Complaint Chief Complaint  Patient presents with  . Dental Pain    HPI Cameron Chavez is a 28 y.o. male.     Patient presents to the emergency department for evaluation of dental pain.  Patient has had ongoing problems with a broken tooth on the right lower of his mouth.  He reports that he has a dentist appointment in the morning but he has suddenly had increased pain and swelling of the right side of his jaw today.  He had been taking some leftover clindamycin, but has run out.  He has not had any fever.     No past medical history on file.  There are no active problems to display for this patient.   Past Surgical History:  Procedure Laterality Date  . WISDOM TOOTH EXTRACTION          Home Medications    Prior to Admission medications   Medication Sig Start Date End Date Taking? Authorizing Provider  benzonatate (TESSALON PERLES) 100 MG capsule Take 1-2 by mouth every 8 hours as needed for cough 08/04/18   Mahala Menghini, PA-C  clindamycin (CLEOCIN) 150 MG capsule Take 2 capsules (300 mg total) by mouth 4 (four) times daily. 11/08/18   Orpah Greek, MD  ibuprofen (ADVIL) 800 MG tablet Take 1 tablet (800 mg total) by mouth every 6 (six) hours as needed for moderate pain. 11/08/18   Orpah Greek, MD  naproxen (NAPROSYN) 250 MG tablet Take 1 tablet (250 mg total) by mouth 2 (two) times daily as needed for mild pain or moderate pain (take with food). 09/30/18   Francine Graven, DO    Family History Family History  Problem Relation Age of Onset  . Diabetes Other   . Heart disease Father     Social History Social History   Tobacco Use  . Smoking status: Current Every Day Smoker    Packs/day: 0.50    Years: 10.00    Pack years: 5.00    Types: Cigarettes  . Smokeless tobacco: Never Used  Substance Use Topics  . Alcohol  use: No  . Drug use: No     Allergies   Codeine and Penicillins   Review of Systems Review of Systems  HENT: Positive for dental problem.   All other systems reviewed and are negative.    Physical Exam Updated Vital Signs BP (!) 172/106 (BP Location: Right Arm)   Pulse 80   Temp 99 F (37.2 C) (Oral)   Resp 17   SpO2 99%   Physical Exam Vitals signs and nursing note reviewed.  Constitutional:      General: He is not in acute distress.    Appearance: Normal appearance. He is well-developed.  HENT:     Head: Normocephalic and atraumatic.     Jaw: There is normal jaw occlusion. Tenderness (right side) and swelling present.     Right Ear: Hearing normal.     Left Ear: Hearing normal.     Nose: Nose normal.     Mouth/Throat:   Eyes:     Conjunctiva/sclera: Conjunctivae normal.     Pupils: Pupils are equal, round, and reactive to light.  Neck:     Musculoskeletal: Normal range of motion and neck supple.  Cardiovascular:     Rate and Rhythm: Regular rhythm.     Heart  sounds: S1 normal and S2 normal. No murmur. No friction rub. No gallop.   Pulmonary:     Effort: Pulmonary effort is normal. No respiratory distress.     Breath sounds: Normal breath sounds.  Chest:     Chest wall: No tenderness.  Abdominal:     General: Bowel sounds are normal.     Palpations: Abdomen is soft.     Tenderness: There is no abdominal tenderness. There is no guarding or rebound. Negative signs include Murphy's sign and McBurney's sign.     Hernia: No hernia is present.  Musculoskeletal: Normal range of motion.  Skin:    General: Skin is warm and dry.     Findings: No rash.  Neurological:     Mental Status: He is alert and oriented to person, place, and time.     GCS: GCS eye subscore is 4. GCS verbal subscore is 5. GCS motor subscore is 6.     Cranial Nerves: No cranial nerve deficit.     Sensory: No sensory deficit.     Coordination: Coordination normal.  Psychiatric:         Speech: Speech normal.        Behavior: Behavior normal.        Thought Content: Thought content normal.      ED Treatments / Results  Labs (all labs ordered are listed, but only abnormal results are displayed) Labs Reviewed - No data to display  EKG None  Radiology No results found.  Procedures Procedures (including critical care time)  Medications Ordered in ED Medications  clindamycin (CLEOCIN) injection 600 mg (has no administration in time range)  oxyCODONE-acetaminophen (PERCOCET/ROXICET) 5-325 MG per tablet 1 tablet (has no administration in time range)     Initial Impression / Assessment and Plan / ED Course  I have reviewed the triage vital signs and the nursing notes.  Pertinent labs & imaging results that were available during my care of the patient were reviewed by me and considered in my medical decision making (see chart for details).        Patient with dental pain and facial swelling consistent with early dental abscess.  No drainable fluid collections on exam.  He has dental follow-up in the morning.  Final Clinical Impressions(s) / ED Diagnoses   Final diagnoses:  Dental abscess    ED Discharge Orders         Ordered    ibuprofen (ADVIL) 800 MG tablet  Every 6 hours PRN     11/08/18 0002    clindamycin (CLEOCIN) 150 MG capsule  4 times daily     11/08/18 0002           Heaton Sarin, Gwenyth Allegra, MD 11/08/18 0004

## 2018-11-08 NOTE — ED Triage Notes (Signed)
Pt C/O right sided lower dental pain.

## 2018-11-08 NOTE — ED Provider Notes (Signed)
[facility] EMERGENCY DEPARTMENT Provider Note   CSN: [id number] Arrival date & time: 11/08/18  8341    History   Chief Complaint Chief Complaint  Patient presents with  . Dental Pain    HPI Cameron Chavez is a 28 y.o. male.     Patient returns to the ER with continued tooth pain and facial swelling.  Patient seen earlier tonight and diagnosed with dental abscess.  He reports that he feels like the face is now more swollen and he is swelling in his mouth.  He feels like it is causing his throat to swell.     History reviewed. No pertinent past medical history.  There are no active problems to display for this patient.   Past Surgical History:  Procedure Laterality Date  . WISDOM TOOTH EXTRACTION          Home Medications    Prior to Admission medications   Medication Sig Start Date End Date Taking? Authorizing Provider  benzonatate (TESSALON PERLES) 100 MG capsule Take 1-2 by mouth every 8 hours as needed for cough 08/04/18   Mahala Menghini, PA-C  clindamycin (CLEOCIN) 150 MG capsule Take 2 capsules (300 mg total) by mouth 4 (four) times daily. 11/08/18   Orpah Greek, MD  ibuprofen (ADVIL) 800 MG tablet Take 1 tablet (800 mg total) by mouth every 6 (six) hours as needed for moderate pain. 11/08/18   Orpah Greek, MD  naproxen (NAPROSYN) 250 MG tablet Take 1 tablet (250 mg total) by mouth 2 (two) times daily as needed for mild pain or moderate pain (take with food). 09/30/18   Francine Graven, DO    Family History Family History  Problem Relation Age of Onset  . Diabetes Other   . Heart disease Father     Social History Social History   Tobacco Use  . Smoking status: Current Every Day Smoker    Packs/day: 0.50    Years: 10.00    Pack years: 5.00    Types: Cigarettes  . Smokeless tobacco: Never Used  Substance Use Topics  . Alcohol use: No  . Drug use: No     Allergies   Codeine and Penicillins   Review of Systems Review of  Systems  HENT: Positive for dental problem and facial swelling.   All other systems reviewed and are negative.    Physical Exam Updated Vital Signs BP (!) 145/101 (BP Location: Right Arm)   Pulse 81   Temp 98.7 F (37.1 C) (Oral)   Resp 19   SpO2 95%   Physical Exam Vitals signs and nursing note reviewed.  Constitutional:      General: He is not in acute distress.    Appearance: Normal appearance. He is well-developed.  HENT:     Head: Normocephalic and atraumatic.     Jaw: Tenderness and swelling present.     Right Ear: Hearing normal.     Left Ear: Hearing normal.     Nose: Nose normal.  Eyes:     Conjunctiva/sclera: Conjunctivae normal.     Pupils: Pupils are equal, round, and reactive to light.  Neck:     Musculoskeletal: Normal range of motion and neck supple.  Cardiovascular:     Rate and Rhythm: Regular rhythm.     Heart sounds: S1 normal and S2 normal. No murmur. No friction rub. No gallop.   Pulmonary:     Effort: Pulmonary effort is normal. No respiratory distress.     Breath  sounds: Normal breath sounds.  Chest:     Chest wall: No tenderness.  Abdominal:     General: Bowel sounds are normal.     Palpations: Abdomen is soft.     Tenderness: There is no abdominal tenderness. There is no guarding or rebound. Negative signs include Murphy's sign and McBurney's sign.     Hernia: No hernia is present.  Musculoskeletal: Normal range of motion.  Skin:    General: Skin is warm and dry.     Findings: No rash.  Neurological:     Mental Status: He is alert and oriented to person, place, and time.     GCS: GCS eye subscore is 4. GCS verbal subscore is 5. GCS motor subscore is 6.     Cranial Nerves: No cranial nerve deficit.     Sensory: No sensory deficit.     Coordination: Coordination normal.  Psychiatric:        Mood and Affect: Mood is anxious.        Speech: Speech normal.        Behavior: Behavior normal.        Thought Content: Thought content normal.       ED Treatments / Results  Labs (all labs ordered are listed, but only abnormal results are displayed) Labs Reviewed  CBC WITH DIFFERENTIAL/PLATELET - Abnormal; Notable for the following components:      Result Value   WBC 19.3 (*)    Neutro Abs 15.3 (*)    Monocytes Absolute 1.3 (*)    Abs Immature Granulocytes 0.08 (*)    All other components within normal limits  BASIC METABOLIC PANEL - Abnormal; Notable for the following components:   Potassium 3.2 (*)    Glucose, Bld 111 (*)    All other components within normal limits    EKG None  Radiology Ct Maxillofacial W Contrast  Result Date: 11/08/2018 CLINICAL DATA:  Dental abscess.  Right-sided dental pain. EXAM: CT MAXILLOFACIAL WITH CONTRAST TECHNIQUE: Multidetector CT imaging of the maxillofacial structures was performed with intravenous contrast. Multiplanar CT image reconstructions were also generated. CONTRAST:  39mL OMNIPAQUE IOHEXOL 300 MG/ML  SOLN COMPARISON:  None. FINDINGS: Osseous: Large dental caries is present within the right first maxillary molar. There is marked periapical lucency with erosion through the inferior and medial aspect of the maxilla. Minimal lucency is present about the roots of the second molar. Prominent dental caries are present in the left mandibular first and second molars with periapical lucencies associated with the roots of both teeth. There is prominent dental caries in the most posterior maxillary molar bilaterally without periapical disease. The mandible is otherwise intact and located. Upper cervical spine is normal. No other focal osseous abnormalities are present in the face. Orbits: The globes and orbits are within normal limits. Sinuses: Minimal mucosal thickening is present in the inferior maxillary sinuses bilaterally. The paranasal sinuses and mastoid air cells are otherwise clear. Soft tissues: A subperiosteal abscess is present along the medial aspect of the right mandible extending into  the submandibular space. The collection measures 18 x 8 x 34 mm. There is thickening of the scratched at there is asymmetric thickening of the right platysma. There is soft tissue stranding in the right submandibular and subcutaneous space. Enlarged right submandibular lymph nodes are present. There are mildly enlarged submental lymph nodes bilaterally. Limited intracranial: Within normal limits. IMPRESSION: 1. Large dental caries involving the right first mandibular molar with marked periapical lucency and erosion of the bone  extending into a subperiosteal abscess which measures 18 x 8 x 34 mm in the right submandibular space. 2. Inflammatory changes extend into the right submandibular space, the right platysma, and the subcutaneous tissues of the right neck. 3. Enlarged right submandibular and submental lymph nodes are likely reactive. 4. Additional dental disease as described without soft tissue inflammation elsewhere. Electronically Signed   By: San Morelle M.D.   On: 11/08/2018 06:07    Procedures Procedures (including critical care time)  Medications Ordered in ED Medications  iohexol (OMNIPAQUE) 300 MG/ML solution 75 mL (75 mLs Intravenous Contrast Given 11/08/18 0530)     Initial Impression / Assessment and Plan / ED Course  I have reviewed the triage vital signs and the nursing notes.  Pertinent labs & imaging results that were available during my care of the patient were reviewed by me and considered in my medical decision making (see chart for details).        Patient returns with complaints of worsening swelling secondary to dental abscess.  I saw the patient earlier tonight as well.  I do not see any change in the amount of swelling present at this time.  He has very slight swelling of the right side of his face.  He is very anxious, suspect he is having some anxiety affecting his symptoms.  Patient's vital signs remain unremarkable other than mild hypertension.  Blood work  does show leukocytosis.  Patient underwent CT scan to further evaluate.  CT scan does confirm dental abscess that is uncomplicated.  Will continue clindamycin, follow-up with dentist as scheduled for later today.  Final Clinical Impressions(s) / ED Diagnoses   Final diagnoses:  Dental abscess    ED Discharge Orders    None       Orpah Greek, MD 11/08/18 [phone]

## 2018-11-08 NOTE — ED Triage Notes (Signed)
Pt returning tonight for follow up of dental abscess. Pt stating the abscess is spreading.

## 2018-11-10 ENCOUNTER — Other Ambulatory Visit: Payer: Self-pay

## 2018-11-10 ENCOUNTER — Emergency Department (HOSPITAL_COMMUNITY): Payer: Self-pay

## 2018-11-10 ENCOUNTER — Inpatient Hospital Stay (HOSPITAL_COMMUNITY)
Admission: EM | Admit: 2018-11-10 | Discharge: 2018-11-12 | DRG: 159 | Disposition: A | Discharge status: Home / Self Care | Payer: Self-pay | Attending: Oral Surgery | Admitting: Oral Surgery

## 2018-11-10 ENCOUNTER — Encounter (HOSPITAL_COMMUNITY): Payer: Self-pay | Admitting: Emergency Medicine

## 2018-11-10 ENCOUNTER — Emergency Department (HOSPITAL_COMMUNITY): Payer: Self-pay | Admitting: Certified Registered"

## 2018-11-10 ENCOUNTER — Encounter (HOSPITAL_COMMUNITY)
Admission: EM | Disposition: A | Discharge status: Home / Self Care | Payer: Self-pay | Source: Home / Self Care | Attending: Oral Surgery

## 2018-11-10 ENCOUNTER — Encounter (HOSPITAL_COMMUNITY): Payer: Self-pay

## 2018-11-10 ENCOUNTER — Emergency Department (HOSPITAL_COMMUNITY)
Admission: EM | Admit: 2018-11-10 | Discharge: 2018-11-10 | Disposition: A | Discharge status: Other Acute Inpatient Hospital | Payer: Self-pay | Attending: Emergency Medicine | Admitting: Emergency Medicine

## 2018-11-10 DIAGNOSIS — F17210 Nicotine dependence, cigarettes, uncomplicated: Secondary | ICD-10-CM | POA: Insufficient documentation

## 2018-11-10 DIAGNOSIS — K047 Periapical abscess without sinus: Secondary | ICD-10-CM | POA: Insufficient documentation

## 2018-11-10 DIAGNOSIS — Z885 Allergy status to narcotic agent status: Secondary | ICD-10-CM

## 2018-11-10 DIAGNOSIS — R221 Localized swelling, mass and lump, neck: Secondary | ICD-10-CM | POA: Diagnosis present

## 2018-11-10 DIAGNOSIS — R220 Localized swelling, mass and lump, head: Secondary | ICD-10-CM | POA: Diagnosis present

## 2018-11-10 DIAGNOSIS — M272 Inflammatory conditions of jaws: Secondary | ICD-10-CM | POA: Diagnosis present

## 2018-11-10 DIAGNOSIS — Z880 Allergy status to penicillin: Secondary | ICD-10-CM

## 2018-11-10 DIAGNOSIS — K122 Cellulitis and abscess of mouth: Principal | ICD-10-CM | POA: Diagnosis present

## 2018-11-10 DIAGNOSIS — Z1159 Encounter for screening for other viral diseases: Secondary | ICD-10-CM

## 2018-11-10 HISTORY — PX: INCISION AND DRAINAGE ABSCESS: SHX5864

## 2018-11-10 HISTORY — PX: TOOTH EXTRACTION: SHX859

## 2018-11-10 LAB — BASIC METABOLIC PANEL
Anion gap: 9 (ref 5–15)
BUN: 7 mg/dL (ref 6–20)
CO2: 23 mmol/L (ref 22–32)
Calcium: 8.7 mg/dL — ABNORMAL LOW (ref 8.9–10.3)
Chloride: 106 mmol/L (ref 98–111)
Creatinine, Ser: 0.89 mg/dL (ref 0.61–1.24)
GFR calc Af Amer: >60 mL/min (ref >60)
GFR calc non Af Amer: >60 mL/min (ref >60)
Glucose, Bld: 113 mg/dL — ABNORMAL HIGH (ref 70–99)
Potassium: 3.3 mmol/L — ABNORMAL LOW (ref 3.5–5.1)
Sodium: 138 mmol/L (ref 135–145)

## 2018-11-10 LAB — CBC WITH DIFFERENTIAL/PLATELET
Abs Immature Granulocytes: 0.1 10*3/uL — ABNORMAL HIGH (ref 0.00–0.07)
Basophils Absolute: 0 10*3/uL (ref 0.0–0.1)
Basophils Relative: 0 %
Eosinophils Absolute: 0.3 10*3/uL (ref 0.0–0.5)
Eosinophils Relative: 2 %
HCT: 43.2 % (ref 39.0–52.0)
Hemoglobin: 14.5 g/dL (ref 13.0–17.0)
Immature Granulocytes: 1 %
Lymphocytes Relative: 12 %
Lymphs Abs: 2.1 10*3/uL (ref 0.7–4.0)
MCH: 30.7 pg (ref 26.0–34.0)
MCHC: 33.6 g/dL (ref 30.0–36.0)
MCV: 91.3 fL (ref 80.0–100.0)
Monocytes Absolute: 1.3 10*3/uL — ABNORMAL HIGH (ref 0.1–1.0)
Monocytes Relative: 7 %
Neutro Abs: 14.4 10*3/uL — ABNORMAL HIGH (ref 1.7–7.7)
Neutrophils Relative %: 78 %
Platelets: 267 10*3/uL (ref 150–400)
RBC: 4.73 MIL/uL (ref 4.22–5.81)
RDW: 12.6 % (ref 11.5–15.5)
WBC: 18.1 10*3/uL — ABNORMAL HIGH (ref 4.0–10.5)
nRBC: 0 % (ref 0.0–0.2)

## 2018-11-10 LAB — SARS CORONAVIRUS 2 BY RT PCR (HOSPITAL ORDER, PERFORMED IN ~~LOC~~ HOSPITAL LAB): SARS Coronavirus 2: NEGATIVE

## 2018-11-10 SURGERY — INCISION AND DRAINAGE, ABSCESS
Anesthesia: General | Laterality: Right

## 2018-11-10 MED ORDER — GLYCOPYRROLATE PF 0.2 MG/ML IJ SOSY
PREFILLED_SYRINGE | INTRAMUSCULAR | Status: DC | PRN
  Administered 2018-11-10: .2 mg via INTRAVENOUS

## 2018-11-10 MED ORDER — DIPHENHYDRAMINE HCL 25 MG PO CAPS: 25.0000 mg | ORAL_CAPSULE | Freq: Four times a day (QID) | ORAL | Status: DC | PRN

## 2018-11-10 MED ORDER — PROMETHAZINE HCL 25 MG/ML IJ SOLN: 6.2500 mg | INTRAMUSCULAR | Status: DC | PRN

## 2018-11-10 MED ORDER — CLINDAMYCIN PHOSPHATE 600 MG/50ML IV SOLN
600.0000 mg | Freq: Four times a day (QID) | INTRAVENOUS | Status: DC
  Administered 2018-11-10 – 2018-11-12 (×7): 600 mg via INTRAVENOUS
  Filled 2018-11-10 (×7): qty 50

## 2018-11-10 MED ORDER — METOPROLOL TARTRATE 5 MG/5ML IV SOLN: 5.0000 mg | Freq: Four times a day (QID) | INTRAVENOUS | Status: DC | PRN

## 2018-11-10 MED ORDER — ROCURONIUM BROMIDE 10 MG/ML (PF) SYRINGE
PREFILLED_SYRINGE | INTRAVENOUS | Status: DC | PRN
  Administered 2018-11-10: 30 mg via INTRAVENOUS

## 2018-11-10 MED ORDER — OXYCODONE HCL 5 MG PO TABS: 5.0000 mg | ORAL_TABLET | Freq: Once | ORAL | Status: DC | PRN

## 2018-11-10 MED ORDER — ENOXAPARIN SODIUM 40 MG/0.4ML ~~LOC~~ SOLN
40.0000 mg | SUBCUTANEOUS | Status: DC
  Administered 2018-11-11: 10:00:00 40 mg via SUBCUTANEOUS
  Filled 2018-11-10 (×2): qty 0.4

## 2018-11-10 MED ORDER — 0.9 % SODIUM CHLORIDE (POUR BTL) OPTIME
TOPICAL | Status: DC | PRN
  Administered 2018-11-10: 1000 mL

## 2018-11-10 MED ORDER — MIDAZOLAM HCL 5 MG/5ML IJ SOLN
INTRAMUSCULAR | Status: DC | PRN
  Administered 2018-11-10: 2 mg via INTRAVENOUS

## 2018-11-10 MED ORDER — LIDOCAINE-EPINEPHRINE 2 %-1:100000 IJ SOLN
INTRAMUSCULAR | Status: DC | PRN
  Administered 2018-11-10: 20 mL

## 2018-11-10 MED ORDER — SUCCINYLCHOLINE CHLORIDE 200 MG/10ML IV SOSY
PREFILLED_SYRINGE | INTRAVENOUS | Status: AC
  Filled 2018-11-10: qty 10

## 2018-11-10 MED ORDER — SUCCINYLCHOLINE CHLORIDE 20 MG/ML IJ SOLN
INTRAMUSCULAR | Status: DC | PRN
  Administered 2018-11-10: 140 mg via INTRAVENOUS

## 2018-11-10 MED ORDER — LACTATED RINGERS IV SOLN
Freq: Once | INTRAVENOUS | Status: AC
  Administered 2018-11-10: 16:00:00 via INTRAVENOUS

## 2018-11-10 MED ORDER — LIDOCAINE-EPINEPHRINE (PF) 1 %-1:200000 IJ SOLN
INTRAMUSCULAR | Status: AC
  Filled 2018-11-10: qty 30

## 2018-11-10 MED ORDER — LIDOCAINE 2% (20 MG/ML) 5 ML SYRINGE
INTRAMUSCULAR | Status: AC
  Filled 2018-11-10: qty 5

## 2018-11-10 MED ORDER — PROPOFOL 10 MG/ML IV BOLUS
INTRAVENOUS | Status: DC | PRN
  Administered 2018-11-10: 200 mg via INTRAVENOUS

## 2018-11-10 MED ORDER — IOHEXOL 300 MG/ML  SOLN
75.0000 mL | Freq: Once | INTRAMUSCULAR | Status: AC | PRN
  Administered 2018-11-10: 100 mL via INTRAVENOUS

## 2018-11-10 MED ORDER — FENTANYL CITRATE (PF) 250 MCG/5ML IJ SOLN
INTRAMUSCULAR | Status: AC
  Filled 2018-11-10: qty 5

## 2018-11-10 MED ORDER — DEXTROSE-NACL 5-0.45 % IV SOLN
INTRAVENOUS | Status: DC
  Administered 2018-11-10: 18:00:00 via INTRAVENOUS

## 2018-11-10 MED ORDER — GLYCOPYRROLATE PF 0.2 MG/ML IJ SOSY
PREFILLED_SYRINGE | INTRAMUSCULAR | Status: AC
  Filled 2018-11-10: qty 1

## 2018-11-10 MED ORDER — DEXAMETHASONE SODIUM PHOSPHATE 10 MG/ML IJ SOLN
INTRAMUSCULAR | Status: AC
  Filled 2018-11-10: qty 1

## 2018-11-10 MED ORDER — LIDOCAINE 2% (20 MG/ML) 5 ML SYRINGE
INTRAMUSCULAR | Status: DC | PRN
  Administered 2018-11-10: 80 mg via INTRAVENOUS

## 2018-11-10 MED ORDER — LACTATED RINGERS IV SOLN
INTRAVENOUS | Status: DC | PRN
  Administered 2018-11-10: 16:00:00 via INTRAVENOUS

## 2018-11-10 MED ORDER — OXYCODONE HCL 5 MG/5ML PO SOLN: 5.0000 mg | Freq: Once | ORAL | Status: DC | PRN

## 2018-11-10 MED ORDER — ROCURONIUM BROMIDE 10 MG/ML (PF) SYRINGE
PREFILLED_SYRINGE | INTRAVENOUS | Status: AC
  Filled 2018-11-10: qty 10

## 2018-11-10 MED ORDER — DIPHENHYDRAMINE HCL 50 MG/ML IJ SOLN: 25.0000 mg | Freq: Four times a day (QID) | INTRAMUSCULAR | Status: DC | PRN

## 2018-11-10 MED ORDER — ONDANSETRON HCL 4 MG/2ML IJ SOLN
INTRAMUSCULAR | Status: AC
  Filled 2018-11-10: qty 2

## 2018-11-10 MED ORDER — FENTANYL CITRATE (PF) 100 MCG/2ML IJ SOLN
INTRAMUSCULAR | Status: DC | PRN
  Administered 2018-11-10: 50 ug via INTRAVENOUS
  Administered 2018-11-10 (×2): 100 ug via INTRAVENOUS

## 2018-11-10 MED ORDER — HYDROMORPHONE HCL 1 MG/ML IJ SOLN: 1.0000 mg | INTRAMUSCULAR | Status: DC | PRN

## 2018-11-10 MED ORDER — KETOROLAC TROMETHAMINE 15 MG/ML IJ SOLN: 15.0000 mg | Freq: Four times a day (QID) | INTRAMUSCULAR | Status: DC | PRN

## 2018-11-10 MED ORDER — OXYCODONE HCL 5 MG PO TABS: 5.0000 mg | ORAL_TABLET | ORAL | Status: DC | PRN

## 2018-11-10 MED ORDER — KETOROLAC TROMETHAMINE 15 MG/ML IJ SOLN
15.0000 mg | Freq: Four times a day (QID) | INTRAMUSCULAR | Status: AC
  Administered 2018-11-10: 19:00:00 15 mg via INTRAVENOUS
  Filled 2018-11-10: qty 1

## 2018-11-10 MED ORDER — ONDANSETRON 4 MG PO TBDP: 4.0000 mg | ORAL_TABLET | Freq: Four times a day (QID) | ORAL | Status: DC | PRN

## 2018-11-10 MED ORDER — LIDOCAINE-EPINEPHRINE 2 %-1:100000 IJ SOLN
INTRAMUSCULAR | Status: AC
  Filled 2018-11-10: qty 1

## 2018-11-10 MED ORDER — ACETAMINOPHEN 500 MG PO TABS
1000.0000 mg | ORAL_TABLET | Freq: Four times a day (QID) | ORAL | Status: DC
  Administered 2018-11-10 – 2018-11-12 (×7): 1000 mg via ORAL
  Filled 2018-11-10 (×7): qty 2

## 2018-11-10 MED ORDER — ONDANSETRON HCL 4 MG/2ML IJ SOLN: 4.0000 mg | Freq: Four times a day (QID) | INTRAMUSCULAR | Status: DC | PRN

## 2018-11-10 MED ORDER — MIDAZOLAM HCL 2 MG/2ML IJ SOLN
INTRAMUSCULAR | Status: AC
  Filled 2018-11-10: qty 2

## 2018-11-10 MED ORDER — FENTANYL CITRATE (PF) 100 MCG/2ML IJ SOLN: 25.0000 ug | INTRAMUSCULAR | Status: DC | PRN

## 2018-11-10 MED ORDER — PROPOFOL 10 MG/ML IV BOLUS
INTRAVENOUS | Status: AC
  Filled 2018-11-10: qty 20

## 2018-11-10 SURGICAL SUPPLY — 64 items
BLADE SURG 15 STRL LF DISP TIS (BLADE) ×2 IMPLANT
BLADE SURG 15 STRL SS (BLADE) ×2
BNDG CONFORM 2 STRL LF (GAUZE/BANDAGES/DRESSINGS) ×4 IMPLANT
BNDG GAUZE ELAST 4 BULKY (GAUZE/BANDAGES/DRESSINGS) ×4 IMPLANT
BUR CROSS CUT FISSURE 1.2 (BURR) ×3 IMPLANT
BUR CROSS CUT FISSURE 1.2MM (BURR) ×1
BUR CROSS CUT FISSURE 1.6 (BURR) ×3 IMPLANT
BUR CROSS CUT FISSURE 1.6MM (BURR) ×1
BUR EGG ELITE 4.0 (BURR) ×3 IMPLANT
BUR EGG ELITE 4.0MM (BURR) ×1
CANISTER SUCT 3000ML PPV (MISCELLANEOUS) ×4 IMPLANT
COVER SURGICAL LIGHT HANDLE (MISCELLANEOUS) ×8 IMPLANT
COVER WAND RF STERILE (DRAPES) ×4 IMPLANT
DECANTER SPIKE VIAL GLASS SM (MISCELLANEOUS) ×4 IMPLANT
DRAIN PENROSE 1/4X12 LTX STRL (WOUND CARE) ×8 IMPLANT
DRAPE U-SHAPE 76X120 STRL (DRAPES) ×4 IMPLANT
DRSG PAD ABDOMINAL 8X10 ST (GAUZE/BANDAGES/DRESSINGS) IMPLANT
ELECT COATED BLADE 2.86 ST (ELECTRODE) IMPLANT
ELECT REM PT RETURN 9FT ADLT (ELECTROSURGICAL) ×4
ELECTRODE REM PT RTRN 9FT ADLT (ELECTROSURGICAL) ×2 IMPLANT
GAUZE 4X4 16PLY RFD (DISPOSABLE) ×4 IMPLANT
GAUZE PACKING FOLDED 2  STR (GAUZE/BANDAGES/DRESSINGS) ×2
GAUZE PACKING FOLDED 2 STR (GAUZE/BANDAGES/DRESSINGS) ×2 IMPLANT
GAUZE SPONGE 4X4 12PLY STRL (GAUZE/BANDAGES/DRESSINGS) IMPLANT
GAUZE SPONGE 4X4 12PLY STRL LF (GAUZE/BANDAGES/DRESSINGS) ×4 IMPLANT
GLOVE BIO SURGEON STRL SZ 6.5 (GLOVE) IMPLANT
GLOVE BIO SURGEON STRL SZ7 (GLOVE) IMPLANT
GLOVE BIO SURGEON STRL SZ7.5 (GLOVE) ×4 IMPLANT
GLOVE BIO SURGEONS STRL SZ 6.5 (GLOVE)
GLOVE BIOGEL PI IND STRL 6.5 (GLOVE) IMPLANT
GLOVE BIOGEL PI IND STRL 7.0 (GLOVE) IMPLANT
GLOVE BIOGEL PI INDICATOR 6.5 (GLOVE)
GLOVE BIOGEL PI INDICATOR 7.0 (GLOVE)
GOWN STRL REUS W/ TWL LRG LVL3 (GOWN DISPOSABLE) ×2 IMPLANT
GOWN STRL REUS W/ TWL XL LVL3 (GOWN DISPOSABLE) ×2 IMPLANT
GOWN STRL REUS W/TWL LRG LVL3 (GOWN DISPOSABLE) ×2
GOWN STRL REUS W/TWL XL LVL3 (GOWN DISPOSABLE) ×2
IV NS 1000ML (IV SOLUTION) ×2
IV NS 1000ML BAXH (IV SOLUTION) ×2 IMPLANT
KIT BASIN OR (CUSTOM PROCEDURE TRAY) ×4 IMPLANT
KIT TURNOVER KIT B (KITS) ×4 IMPLANT
MARKER SKIN DUAL TIP RULER LAB (MISCELLANEOUS) ×4 IMPLANT
NEEDLE 22X1 1/2 (OR ONLY) (NEEDLE) ×8 IMPLANT
NEEDLE HYPO 25GX1X1/2 BEV (NEEDLE) ×4 IMPLANT
NS IRRIG 1000ML POUR BTL (IV SOLUTION) ×4 IMPLANT
PACK SURGICAL SETUP 50X90 (CUSTOM PROCEDURE TRAY) ×4 IMPLANT
PAD ARMBOARD 7.5X6 YLW CONV (MISCELLANEOUS) ×8 IMPLANT
PENCIL BUTTON HOLSTER BLD 10FT (ELECTRODE) IMPLANT
POSITIONER HEAD DONUT 9IN (MISCELLANEOUS) IMPLANT
SLEEVE IRRIGATION ELITE 7 (MISCELLANEOUS) ×8 IMPLANT
SPONGE SURGIFOAM ABS GEL 12-7 (HEMOSTASIS) IMPLANT
SUT CHROMIC 3 0 PS 2 (SUTURE) ×8 IMPLANT
SUT ETHILON 2 0 FS 18 (SUTURE) ×4 IMPLANT
SUT VIC AB 3-0 FS2 27 (SUTURE) ×4 IMPLANT
SUT VIC AB 4-0 PS2 18 (SUTURE) ×4 IMPLANT
SWAB COLLECTION DEVICE MRSA (MISCELLANEOUS) ×4 IMPLANT
SWAB CULTURE ESWAB REG 1ML (MISCELLANEOUS) ×4 IMPLANT
SYR BULB IRRIGATION 50ML (SYRINGE) ×4 IMPLANT
SYR CONTROL 10ML LL (SYRINGE) ×4 IMPLANT
TRAY ENT MC OR (CUSTOM PROCEDURE TRAY) ×4 IMPLANT
TUBE CONNECTING 12'X1/4 (SUCTIONS) ×1
TUBE CONNECTING 12X1/4 (SUCTIONS) ×3 IMPLANT
TUBING IRRIGATION (MISCELLANEOUS) ×4 IMPLANT
YANKAUER SUCT BULB TIP NO VENT (SUCTIONS) ×4 IMPLANT

## 2018-11-10 NOTE — ED Notes (Signed)
Patient mother [phone] Cameron Chavez.

## 2018-11-10 NOTE — Anesthesia Procedure Notes (Signed)
Procedure Name: Intubation Date/Time: 11/10/2018 4:16 PM Performed by: Moshe Salisbury, CRNA Pre-anesthesia Checklist: Patient identified, Emergency Drugs available, Suction available and Patient being monitored Patient Re-evaluated:Patient Re-evaluated prior to induction Oxygen Delivery Method: Circle System Utilized Preoxygenation: Pre-oxygenation with 100% oxygen Induction Type: IV induction Ventilation: Mask ventilation without difficulty Laryngoscope Size: Glidescope and 4 Grade View: Grade I Tube type: Oral Tube size: 8.0 mm Number of attempts: 1 Airway Equipment and Method: Rigid stylet and Video-laryngoscopy Placement Confirmation: ETT inserted through vocal cords under direct vision,  positive ETCO2 and breath sounds checked- equal and bilateral Secured at: 22 cm Tube secured with: Tape Dental Injury: Teeth and Oropharynx as per pre-operative assessment

## 2018-11-10 NOTE — ED Notes (Signed)
Pt instructed to go to [facility] ED. IV removed . States understanding

## 2018-11-10 NOTE — ED Triage Notes (Signed)
Pt transferred from [facility] to see oral surgery for a dental abscess on the right side of his mouth. Pt a.o, airway intact

## 2018-11-10 NOTE — Discharge Instructions (Signed)
Go directly to [facility] ED. Let them know that you have been transferred from [facility] to see oral surgery, Dr. Stefanie Libel.

## 2018-11-10 NOTE — ED Notes (Signed)
Pt returned from CT °

## 2018-11-10 NOTE — Anesthesia Preprocedure Evaluation (Addendum)
Anesthesia Evaluation  Patient identified by MRN, date of birth, ID band Patient awake    Reviewed: Allergy & Precautions, NPO status , Patient's Chart, lab work & pertinent test results  History of Anesthesia Complications Negative for: history of anesthetic complications  Airway Mallampati: IV  TM Distance: >3 FB Neck ROM: Limited  Mouth opening: Limited Mouth Opening  Dental  (+) Poor Dentition, Dental Advisory Given, Chipped   Pulmonary Current Smoker,    breath sounds clear to auscultation       Cardiovascular negative cardio ROS   Rhythm:Regular Rate:Normal     Neuro/Psych negative neurological ROS  negative psych ROS   GI/Hepatic negative GI ROS, Neg liver ROS,   Endo/Other   Obesity Hypokalemia Hypocalcemia   Renal/GU negative Renal ROS     Musculoskeletal negative musculoskeletal ROS (+)   Abdominal (+) + obese,   Peds  Hematology  Leukocytosis    Anesthesia Other Findings CT Neck - 1. Enlarging subperiosteal abscess associated with focal dental caries and periapical abscess of the right first mandibular molar. There is increased thickening at the periphery of the subperiosteal abscess. The abscess now measures 3.9 x 2.7 x 1.6 cm. 2. Additional dental disease in the left mandible is stable. 3. Extensive soft tissue swelling in the right neck and reactive adenopathy is similar the prior exam.  Anaphylaxis from codeine, ok with oxycodone   Reproductive/Obstetrics                           Anesthesia Physical Anesthesia Plan  ASA: II and emergent  Anesthesia Plan: General   Post-op Pain Management:    Induction: Intravenous  PONV Risk Score and Plan: 2 and Treatment may vary due to age or medical condition, Ondansetron, Dexamethasone and Midazolam  Airway Management Planned: Oral ETT and Video Laryngoscope Planned  Additional Equipment: None  Intra-op Plan:    Post-operative Plan: Possible Post-op intubation/ventilation  Informed Consent: I have reviewed the patients History and Physical, chart, labs and discussed the procedure including the risks, benefits and alternatives for the proposed anesthesia with the patient or authorized representative who has indicated his/her understanding and acceptance.     Dental advisory given  Plan Discussed with: CRNA and Anesthesiologist  Anesthesia Plan Comments: ( Will plan for glidescope intubation if able to adequately open mouth after sedation and paralysis. Otherwise, will have fiberoptic setup in room as backup.)     Anesthesia Quick Evaluation

## 2018-11-10 NOTE — Op Note (Signed)
Cameron Chavez, BLANK MEDICAL RECORD DX:[id number] ACCOUNT [id number] DATE OF BIRTH:12-Jan-1991 FACILITY: [facility] LOCATION: [facility] PHYSICIAN:Ariannah Arenson M. Jozy Mcphearson, DDS  OPERATIVE REPORT  DATE OF PROCEDURE:  11/10/2018  PREOPERATIVE DIAGNOSES:  Right submandibular space abscess, nonrestorable right lower molars.  POSTOPERATIVE DIAGNOSES:  Right submandibular space abscess, nonrestorable right lower molars.  PROCEDURE:  Incision and drainage of right submandibular space abscess, extraction of teeth numbers 30, 31.  SURGEON:  Diona Browner, DDS  ANESTHESIA:  General, oral intubation.  DESCRIPTION OF PROCEDURE:  The patient was taken to the operating room and placed on the operating table in supine position.  General anesthesia was administered intravenously, and an oral endotracheal tube was placed and secured to the left side.  The  eyes were protected, and the patient was prepped and draped.  A timeout was performed.  The right mandible was palpated, and a sterile marking pen was used to mark a horizontal incision, point approximately 2 cm below the inferior border of the mandible.   Then local anesthesia 2% lidocaine 1:100,000 epinephrine was infiltrated along the line of incision and injected upwards to the inferior border of the mandible from the site of the incision.  Then a 15 blade was used to make the incision through skin  and subcutaneous tissue.  Blunt dissection was carried forward with a hemostat until the inferior border of the mandible was reached.  At that point, copious amounts of purulence flowed through the incision.  Aerobic and anaerobic cultures were obtained.   Then the hemostats were used to point under the mandible both anterior and posterior and lingual in the area of the molars, and some of the subcutaneous areas were dissected anteriorly where the largest point of induration was noted.  Then the area was  irrigated and packed, and attention was turned to the oral cavity.  A  bite block was placed inside the mouth.  The throat pack was placed.  Then, 2% lidocaine 1:100,000 epinephrine was infiltrated in an inferior alveolar block on the right side and  buccal infiltration.  Then a 15 blade was used to make an incision around teeth numbers 30 and 31.  The teeth were elevated and fractured upon attempted removal.  Then bone was removed using the Stryker handpiece, and the teeth were sectioned and removed  in roots.  Then the sockets were curetted, irrigated.  No closure was needed.  The oral cavity was irrigated and suctioned, and the throat pack was removed.  Then attention was turned to the incision on the right neck.  The area was irrigated once more,  and then a Penrose drain was placed and slid along towards the anterior region of the mandible, and another Penrose was placed towards the lingual portion in the posterior region.  These were secured to the skin with 4-0 nylon, and then 4-0 nylon was  used to close the skin incision on either side of the drains.  Then a sterile dressing was placed.  The patient was left in the care of anesthesia for extubation and transferred to recovery room with plans for admission, to receive IV antibiotics.  ESTIMATED BLOOD LOSS:  Minimal.  COMPLICATIONS:  None.  SPECIMENS:  Drain x2, right submandibular space.  LN/NUANCE  D:11/10/2018 T:11/10/2018 JOB:[id number]

## 2018-11-10 NOTE — ED Provider Notes (Signed)
[facility] EMERGENCY DEPARTMENT Provider Note   CSN: [id number] Arrival date & time: 11/10/18  9528    History   Chief Complaint Chief Complaint  Patient presents with  . Dental Pain    HPI Cameron Chavez is a 28 y.o. male.     The history is provided by the patient and medical records. No language interpreter was used.  Dental Pain Associated symptoms: facial swelling    Cameron Chavez is a 28 y.o. male who presents the emergency department complaining of progressively worsening right lower dental pain and swelling.  He states that he broke a tooth on right lower part of his mouth and has had intermittent issues with that for quite some time now.  He started having pain the eighth.  He was seen the emergency department on Wednesday, July 8 where he was started on clindamycin.  He then returned to the emergency department the following day because he felt as if the swelling was little worse.  He underwent CT scan at that point showing confirmed dental abscess.  White count was 19.  He was instructed to follow-up with his dentist later that day as scheduled.  He states that he actually did see his dentist which was with the [facility] on July 9 (2 days ago).  He states that they told him to continue his antibiotic course.  He would need the affected tooth cut out, but to wait until after this active infection has resolved.  He states that this morning, he awoke and the swelling was much more severe.  He felt as if it had moved down to his neck.  He was having difficulty opening his mouth.  He checked his temperature and it was 100.8.  Given that his symptoms were drastically worsening despite taking all of his clindamycin as directed (he has been taking 300 mg 4 times a day for the last 2 days), he decided to come to the emergency department for reevaluation.    History reviewed. No pertinent past medical history.  There are no active problems to display for this  patient.   Past Surgical History:  Procedure Laterality Date  . WISDOM TOOTH EXTRACTION          Home Medications    Prior to Admission medications   Medication Sig Start Date End Date Taking? Authorizing Provider  clindamycin (CLEOCIN) 150 MG capsule Take 2 capsules (300 mg total) by mouth 4 (four) times daily. 11/08/18  Yes Pollina, Gwenyth Allegra, MD  ibuprofen (ADVIL) 800 MG tablet Take 1 tablet (800 mg total) by mouth every 6 (six) hours as needed for moderate pain. 11/08/18  Yes Pollina, Gwenyth Allegra, MD  benzonatate (TESSALON PERLES) 100 MG capsule Take 1-2 by mouth every 8 hours as needed for cough Patient not taking: Reported on 11/10/2018 08/04/18   Mahala Menghini, PA-C  naproxen (NAPROSYN) 250 MG tablet Take 1 tablet (250 mg total) by mouth 2 (two) times daily as needed for mild pain or moderate pain (take with food). Patient not taking: Reported on 11/10/2018 09/30/18   Francine Graven, DO    Family History Family History  Problem Relation Age of Onset  . Diabetes Other   . Heart disease Father     Social History Social History   Tobacco Use  . Smoking status: Current Every Day Smoker    Packs/day: 0.50    Years: 10.00    Pack years: 5.00    Types: Cigarettes  . Smokeless  tobacco: Never Used  Substance Use Topics  . Alcohol use: No  . Drug use: No     Allergies   Codeine and Penicillins   Review of Systems Review of Systems  HENT: Positive for dental problem and facial swelling. Negative for trouble swallowing.   Respiratory: Negative for shortness of breath.   All other systems reviewed and are negative.    Physical Exam Updated Vital Signs BP (!) 150/85 (BP Location: Left Arm)   Pulse 83   Temp 98.8 F (37.1 C) (Oral)   Resp 18   Ht 5\' 9"  (1.753 m)   Wt 104.3 kg   SpO2 97%   BMI 33.97 kg/m   Physical Exam Vitals signs and nursing note reviewed.  Constitutional:      General: He is not in acute distress.    Appearance: He is  well-developed.  HENT:     Head: Normocephalic.     Mouth/Throat:     Comments: Poor dentition. Diffuse tenderness to right lower gumline. + Trismus able to open jaw about 2 finger widths.  Neck:     Musculoskeletal: Neck supple.     Comments: Extensive swelling and tenderness to the right submandibular space and submental space.  Cardiovascular:     Rate and Rhythm: Normal rate and regular rhythm.     Heart sounds: Normal heart sounds. No murmur.  Pulmonary:     Effort: Pulmonary effort is normal. No respiratory distress.     Breath sounds: Normal breath sounds.  Skin:    General: Skin is warm and dry.  Neurological:     Mental Status: He is alert and oriented to person, place, and time.      ED Treatments / Results  Labs (all labs ordered are listed, but only abnormal results are displayed) Labs Reviewed  CBC WITH DIFFERENTIAL/PLATELET - Abnormal; Notable for the following components:      Result Value   WBC 18.1 (*)    Neutro Abs 14.4 (*)    Monocytes Absolute 1.3 (*)    Abs Immature Granulocytes 0.10 (*)    All other components within normal limits  BASIC METABOLIC PANEL - Abnormal; Notable for the following components:   Potassium 3.3 (*)    Glucose, Bld 113 (*)    Calcium 8.7 (*)    All other components within normal limits    EKG None  Radiology Ct Soft Tissue Neck W Contrast  Result Date: 11/10/2018 CLINICAL DATA:  Chin swelling and pain. Redness. Submandibular swelling. Known subperiosteal abscess and dental caries. Patient seen for same issue 2 days ago. EXAM: CT NECK WITH CONTRAST TECHNIQUE: Multidetector CT imaging of the neck was performed using the standard protocol following the bolus administration of intravenous contrast. CONTRAST:  145mL OMNIPAQUE IOHEXOL 300 MG/ML  SOLN COMPARISON:  CT of the face 11/08/2018 FINDINGS: Pharynx and larynx: There is progressive mass effect from the enlarging subperiosteal abscess about the right mandible. Intrinsic tongue  muscles are displaced to the left. There is mild prominence of the adenoid tissue and palatine tonsils, slightly increased from prior study. No focal mucosal or submucosal lesions are present. Epiglottis is within normal limits. Hypopharynx is clear. Vocal cords are midline and symmetric. Salivary glands: There is increased mass effect on the right submandibular gland. Submandibular and parotid glands are otherwise normal. Thyroid: Normal. Lymph nodes: Submental and submandibular lymph nodes are again seen similar the prior study. There is diffuse stranding in the subcutaneous fat over the anterior neck, right greater  than left. There is diffuse thickening of the right platysma muscle. Vascular: Significant vascular disease is present Limited intracranial: Within normal limits Visualized orbits: The globes and orbits are within normal limits. Mastoids and visualized paranasal sinuses: Minimal mucosal thickening is again noted in the inferior maxillary sinuses bilaterally. The paranasal sinuses and mastoid air cells are otherwise clear. Skeleton: There straightening and some reversal of the normal cervical lordosis. No focal lytic or blastic lesions are present. Upper chest: Lung apices are clear.  Thoracic inlet is normal Other: Large dental caries is again noted in the first right mandibular molar. Extensive periapical disease is present with erosion of the mandible inferiorly and medially. This leads to an enlarging subperiosteal abscess. There is progressive complex thickening along the border of the abscess. The abscess now measures 3.9 x 2.7 x 1.6 cm. It previously measured 3.3 x 1.8 x 0.8 cm. IMPRESSION: 1. Enlarging subperiosteal abscess associated with focal dental caries and periapical abscess of the right first mandibular molar. There is increased thickening at the periphery of the periosteal scratched at there is increased thickening at the periphery of the subperiosteal abscess. The abscess now measures  3.9 x 2.7 x 1.6 cm. 2. Additional dental disease in the left mandible is stable. 3. Extensive soft tissue swelling in the right neck and reactive adenopathy is similar the prior exam. Electronically Signed   By: San Morelle M.D.   On: 11/10/2018 10:46    Procedures Procedures (including critical care time)  Medications Ordered in ED Medications  iohexol (OMNIPAQUE) 300 MG/ML solution 75 mL (100 mLs Intravenous Contrast Given 11/10/18 1008)     Initial Impression / Assessment and Plan / ED Course  I have reviewed the triage vital signs and the nursing notes.  Pertinent labs & imaging results that were available during my care of the patient were reviewed by me and considered in my medical decision making (see chart for details).       Cameron Chavez is a 28 y.o. male who presents to ED for worsening facial swelling 2/2 presumed dental abscess.  Chart reviewed from previous ED encounters on both 7/08 and 7/09.  He has been compliant with clindamycin antibiotic therapy without any missed doses.  On exam today, he is afebrile with extensive swelling to the right submandibular area as well as trismus.  Seems to be worsening despite antibiotics.  Will repeat CT to further assess.  Leukocytosis of 18.1 CT scan shows enlarging subperiosteal abscess and periapical abscess of the right first mandibular molar now measuring 3.9 x 2.7 x 1.6 with extensive soft tissue swelling in the right neck and reactive adenopathy.   11:15 AM - Spoke with Dr. Stefanie Libel of oral surgery who recommends ED transfer to [facility]. Notify him upon arrival with plan to see and drain in ED. Discussed with EDP at [facility], Dr. Jeanell Sparrow, who accepts patient in transfer.  Patient discussed with Dr. Rogene Houston who agrees with treatment plan.    Final Clinical Impressions(s) / ED Diagnoses   Final diagnoses:  Dental abscess    ED Discharge Orders    None       , Ozella Almond, PA-C 11/10/18 1119     Fredia Sorrow, MD 11/11/18 0730

## 2018-11-10 NOTE — ED Notes (Signed)
Patient undress, and in hospital gown. All personal item bagged.

## 2018-11-10 NOTE — Transfer of Care (Signed)
Immediate Anesthesia Transfer of Care Note  Patient: Cameron Chavez  Procedure(s) Performed: INCISION AND DRAINAGE OF MANDIBLE UNDER ANESTHESIA (Right ) DENTAL RESTORATION/EXTRACTIONS (N/A )  Patient Location: PACU  Anesthesia Type:General  Level of Consciousness: drowsy and patient cooperative  Airway & Oxygen Therapy: Patient Spontanous Breathing and Patient connected to nasal cannula oxygen  Post-op Assessment: Report given to RN, Post -op Vital signs reviewed and stable and Patient moving all extremities  Post vital signs: Reviewed and stable  Last Vitals:  Vitals Value Taken Time  BP 156/88 11/10/18 1701  Temp    Pulse 114 11/10/18 1703  Resp 21 11/10/18 1703  SpO2 100 % 11/10/18 1703  Vitals shown include unvalidated device data.  Last Pain:  Vitals:   11/10/18 1536  TempSrc: Oral  PainSc:          Complications: No apparent anesthesia complications

## 2018-11-10 NOTE — ED Provider Notes (Signed)
[facility] EMERGENCY DEPARTMENT Provider Note   CSN: [id number] Arrival date & time: 11/10/18  1312    History   Chief Complaint Chief Complaint  Patient presents with   Abscess    HPI Cameron Chavez is a 28 y.o. male presents sent from [facility] emergency department for emergent oral surgery evaluation.  The patient was seen and evaluated for dental pain and dental abscess 4 days ago, started on clindamycin which he states he has been taking.  He has had progressively worsening right lower facial swelling extending into the neck and was seen and evaluated again at any pain earlier today.  He underwent imaging which showed an enlarging subperiosteal abscess and periapical abscess.  He notes throbbing pain which worsens with swallowing and movement of the neck.  Denies fever.  Dr. Hoyt Koch the oral surgeon was consulted who recommended transfer to [facility] so that he can see and evaluate the patient intervene.     The history is provided by the patient.    History reviewed. No pertinent past medical history.  Patient Active Problem List   Diagnosis Date Noted   Submandibular swelling 11/10/2018    Past Surgical History:  Procedure Laterality Date   WISDOM TOOTH EXTRACTION          Home Medications    Prior to Admission medications   Medication Sig Start Date End Date Taking? Authorizing Provider  benzonatate (TESSALON PERLES) 100 MG capsule Take 1-2 by mouth every 8 hours as needed for cough Patient not taking: Reported on 11/10/2018 08/04/18   Mahala Menghini, PA-C  clindamycin (CLEOCIN) 150 MG capsule Take 2 capsules (300 mg total) by mouth 4 (four) times daily. 11/08/18   Orpah Greek, MD  ibuprofen (ADVIL) 800 MG tablet Take 1 tablet (800 mg total) by mouth every 6 (six) hours as needed for moderate pain. 11/08/18   Orpah Greek, MD  naproxen (NAPROSYN) 250 MG tablet Take 1 tablet (250 mg total) by mouth 2 (two) times daily as  needed for mild pain or moderate pain (take with food). Patient not taking: Reported on 11/10/2018 09/30/18   Francine Graven, DO    Family History Family History  Problem Relation Age of Onset   Diabetes Other    Heart disease Father     Social History Social History   Tobacco Use   Smoking status: Current Every Day Smoker    Packs/day: 0.50    Years: 10.00    Pack years: 5.00    Types: Cigarettes   Smokeless tobacco: Never Used  Substance Use Topics   Alcohol use: No   Drug use: No     Allergies   Codeine and Penicillins   Review of Systems Review of Systems  Constitutional: Negative for fever.  HENT: Positive for facial swelling and trouble swallowing. Negative for drooling.   Respiratory: Negative for shortness of breath.   Cardiovascular: Negative for chest pain.  All other systems reviewed and are negative.    Physical Exam Updated Vital Signs BP (!) 155/92 (BP Location: Left Arm)    Pulse (!) 113    Temp 99.5 F (37.5 C) (Axillary)    Resp 18    SpO2 98%   Physical Exam Vitals signs and nursing note reviewed.  Constitutional:      General: He is not in acute distress.    Appearance: He is well-developed.  HENT:     Head: Normocephalic and atraumatic.  Comments: Significant right sub-mental and submandibular swelling with tenderness to palpation.  Tolerating oral secretions without difficulty, no abnormal phonation, no upper airway stridor. Eyes:     General:        Right eye: No discharge.        Left eye: No discharge.     Conjunctiva/sclera: Conjunctivae normal.  Neck:     Musculoskeletal: Neck supple.     Vascular: No JVD.     Trachea: No tracheal deviation.     Comments: Tenderness to palpation of the right anterior neck with associated swelling. Cardiovascular:     Rate and Rhythm: Normal rate.  Pulmonary:     Effort: Pulmonary effort is normal.  Abdominal:     General: There is no distension.  Skin:    General: Skin is warm  and dry.     Findings: No erythema.  Neurological:     Mental Status: He is alert.  Psychiatric:        Behavior: Behavior normal.      ED Treatments / Results  Labs (all labs ordered are listed, but only abnormal results are displayed) Labs Reviewed  SARS CORONAVIRUS 2 (HOSPITAL ORDER, [place] [facility])  AEROBIC/ANAEROBIC CULTURE (SURGICAL/DEEP WOUND)  CBC    EKG None  Radiology Ct Soft Tissue Neck W Contrast  Result Date: 11/10/2018 CLINICAL DATA:  Chin swelling and pain. Redness. Submandibular swelling. Known subperiosteal abscess and dental caries. Patient seen for same issue 2 days ago. EXAM: CT NECK WITH CONTRAST TECHNIQUE: Multidetector CT imaging of the neck was performed using the standard protocol following the bolus administration of intravenous contrast. CONTRAST:  174mL OMNIPAQUE IOHEXOL 300 MG/ML  SOLN COMPARISON:  CT of the face 11/08/2018 FINDINGS: Pharynx and larynx: There is progressive mass effect from the enlarging subperiosteal abscess about the right mandible. Intrinsic tongue muscles are displaced to the left. There is mild prominence of the adenoid tissue and palatine tonsils, slightly increased from prior study. No focal mucosal or submucosal lesions are present. Epiglottis is within normal limits. Hypopharynx is clear. Vocal cords are midline and symmetric. Salivary glands: There is increased mass effect on the right submandibular gland. Submandibular and parotid glands are otherwise normal. Thyroid: Normal. Lymph nodes: Submental and submandibular lymph nodes are again seen similar the prior study. There is diffuse stranding in the subcutaneous fat over the anterior neck, right greater than left. There is diffuse thickening of the right platysma muscle. Vascular: Significant vascular disease is present Limited intracranial: Within normal limits Visualized orbits: The globes and orbits are within normal limits. Mastoids and visualized paranasal  sinuses: Minimal mucosal thickening is again noted in the inferior maxillary sinuses bilaterally. The paranasal sinuses and mastoid air cells are otherwise clear. Skeleton: There straightening and some reversal of the normal cervical lordosis. No focal lytic or blastic lesions are present. Upper chest: Lung apices are clear.  Thoracic inlet is normal Other: Large dental caries is again noted in the first right mandibular molar. Extensive periapical disease is present with erosion of the mandible inferiorly and medially. This leads to an enlarging subperiosteal abscess. There is progressive complex thickening along the border of the abscess. The abscess now measures 3.9 x 2.7 x 1.6 cm. It previously measured 3.3 x 1.8 x 0.8 cm. IMPRESSION: 1. Enlarging subperiosteal abscess associated with focal dental caries and periapical abscess of the right first mandibular molar. There is increased thickening at the periphery of the periosteal scratched at there is increased thickening at  the periphery of the subperiosteal abscess. The abscess now measures 3.9 x 2.7 x 1.6 cm. 2. Additional dental disease in the left mandible is stable. 3. Extensive soft tissue swelling in the right neck and reactive adenopathy is similar the prior exam. Electronically Signed   By: San Morelle M.D.   On: 11/10/2018 10:46    Procedures Procedures (including critical care time)  Medications Ordered in ED Medications  dextrose 5 %-0.45 % sodium chloride infusion ( Intravenous Rate/Dose Verify 11/10/18 1827)  HYDROmorphone (DILAUDID) injection 1 mg (has no administration in time range)  metoprolol tartrate (LOPRESSOR) injection 5 mg (has no administration in time range)  enoxaparin (LOVENOX) injection 40 mg (has no administration in time range)  oxyCODONE (Oxy IR/ROXICODONE) immediate release tablet 5-10 mg (has no administration in time range)  ketorolac (TORADOL) 15 MG/ML injection 15 mg (15 mg Intravenous Given 11/10/18 1839)      Followed by  ketorolac (TORADOL) 15 MG/ML injection 15 mg (has no administration in time range)  acetaminophen (TYLENOL) tablet 1,000 mg (1,000 mg Oral Given 11/10/18 1838)  diphenhydrAMINE (BENADRYL) capsule 25 mg (has no administration in time range)    Or  diphenhydrAMINE (BENADRYL) injection 25 mg (has no administration in time range)  ondansetron (ZOFRAN-ODT) disintegrating tablet 4 mg (has no administration in time range)    Or  ondansetron (ZOFRAN) injection 4 mg (has no administration in time range)  clindamycin (CLEOCIN) IVPB 600 mg (600 mg Intravenous New Bag/Given 11/10/18 1842)  lactated ringers infusion ( Intravenous New Bag/Given 11/10/18 1552)  propofol (DIPRIVAN) 10 mg/mL bolus/IV push (has no administration in time range)  fentaNYL (SUBLIMAZE) 250 MCG/5ML injection (has no administration in time range)  midazolam (VERSED) 2 MG/2ML injection (has no administration in time range)  lidocaine 20 MG/ML injection (has no administration in time range)  rocuronium bromide 100 MG/10ML SOSY (has no administration in time range)  lidocaine-EPINEPHrine (XYLOCAINE W/EPI) 2 %-1:100000 (with pres) injection (has no administration in time range)  lidocaine-EPINEPHrine (XYLOCAINE-EPINEPHrine) 1 %-1:200000 (PF) injection (has no administration in time range)  succinylcholine (ANECTINE) 200 MG/10ML syringe (has no administration in time range)  dexamethasone (DECADRON) 10 MG/ML injection (has no administration in time range)  ondansetron (ZOFRAN) 4 MG/2ML injection (has no administration in time range)  glycopyrrolate (ROBINUL) 0.2 MG/ML injection (has no administration in time range)     Initial Impression / Assessment and Plan / ED Course  I have reviewed the triage vital signs and the nursing notes.  Pertinent labs & imaging results that were available during my care of the patient were reviewed by me and considered in my medical decision making (see chart for details).         Patient presenting for evaluation sent from [facility] for worsening subperiosteal and periapical abscess of odontogenic origin.  Patient is afebrile, vital signs are stable.  He is nontoxic in appearance.  No evidence of airway compromise on my assessment.  Dr. Hoyt Koch the oral surgeon was consulted and recommended transfer to [facility] for likely surgical intervention.  2:00 PM Spoke with Dr. Hoyt Koch who is aware of the patient; will get COVID swab prior to transfer to the OR.  Patient continues to rest comfortably in no apparent distress.  His pain is well controlled. Final Clinical Impressions(s) / ED Diagnoses   Final diagnoses:  Subperiosteal abscess of jaw  Periapical abscess without sinus    ED Discharge Orders    None       Allyiah Gartner A, PA-C  11/10/18 1937    Virgel Manifold, MD 11/11/18 [phone]

## 2018-11-10 NOTE — ED Notes (Signed)
Right chin swollen, hard and with redness noted

## 2018-11-10 NOTE — Anesthesia Postprocedure Evaluation (Signed)
Anesthesia Post Note  Patient: JYDEN KROMER  Procedure(s) Performed: INCISION AND DRAINAGE OF MANDIBLE UNDER ANESTHESIA (Right ) DENTAL RESTORATION/EXTRACTIONS (N/A )     Patient location during evaluation: PACU Anesthesia Type: General Level of consciousness: awake and alert Pain management: pain level controlled Vital Signs Assessment: post-procedure vital signs reviewed and stable Respiratory status: spontaneous breathing, nonlabored ventilation and respiratory function stable Cardiovascular status: blood pressure returned to baseline, stable and tachycardic Postop Assessment: no apparent nausea or vomiting Anesthetic complications: no    Last Vitals:  Vitals:   11/10/18 1823 11/10/18 2118  BP: (!) 155/92 132/70  Pulse: (!) 113 82  Resp: 18 18  Temp: 37.5 C 37.3 C  SpO2: 98% 98%    Last Pain:  Vitals:   11/10/18 2118  TempSrc: Oral  PainSc:                  Audry Pili

## 2018-11-10 NOTE — ED Notes (Signed)
Ice pack given to pt per ED provider.

## 2018-11-10 NOTE — ED Notes (Signed)
Pt's belongings packed in belonging's bag with name labeled and ready to be transported to short-stay.

## 2018-11-10 NOTE — H&P (Signed)
HISTORY AND PHYSICAL  Cameron Chavez REASON is a 28 y.o. male patient with CC: painful swelling right jaw.   HPI: Seen in ER at [facility] 11/07/2018 and started on Clindamycin. Symptoms worsened and patient seen again in ER at [facility] today. Transferred to [facility] for Surgery.  No diagnosis found.  History reviewed. No pertinent past medical history.  Current Facility-Administered Medications  Medication Dose Route Frequency Provider Last Rate Last Dose  . 0.9 % irrigation (POUR BTL)    PRN Diona Browner, DDS   1,000 mL at 11/10/18 1537  . fentaNYL (SUBLIMAZE) injection 25-50 mcg  25-50 mcg Intravenous Q5 min PRN Audry Pili, MD      . lidocaine-EPINEPHrine (XYLOCAINE W/EPI) 2 %-1:100000 (with pres) injection    PRN Diona Browner, DDS   20 mL at 11/10/18 1637  . oxyCODONE (Oxy IR/ROXICODONE) immediate release tablet 5 mg  5 mg Oral Once PRN Audry Pili, MD       Or  . oxyCODONE (ROXICODONE) 5 MG/5ML solution 5 mg  5 mg Oral Once PRN Audry Pili, MD      . promethazine (PHENERGAN) injection 6.25-12.5 mg  6.25-12.5 mg Intravenous Q15 min PRN Audry Pili, MD       Allergies  Allergen Reactions  . Codeine Anaphylaxis  . Penicillins Rash    Has patient had a PCN reaction causing immediate rash, facial/tongue/throat swelling, SOB or lightheadedness with hypotension: No Has patient had a PCN reaction causing severe rash involving mucus membranes or skin necrosis: No Has patient had a PCN reaction that required hospitalization: No Has patient had a PCN reaction occurring within the last 10 years: n  If all of the above answers are "NO", then may proceed with Cephalosporin use.    Active Problems:   Submandibular swelling  Vitals: Blood pressure (!) 153/96, pulse 90, temperature 98.9 F (37.2 C), temperature source Oral, resp. rate 16, SpO2 97 %. Lab results: Results for orders placed or performed during the hospital encounter of 11/10/18 (from the past 24 hour(s))  SARS  Coronavirus 2 (CEPHEID - Performed in [facility] hospital lab), Hosp Order     Status: None   Collection Time: 11/10/18  2:06 PM   Specimen: Nasopharyngeal Swab  Result Value Ref Range   SARS Coronavirus 2 NEGATIVE NEGATIVE   Radiology Results: Ct Soft Tissue Neck W Contrast  Result Date: 11/10/2018 CLINICAL DATA:  Chin swelling and pain. Redness. Submandibular swelling. Known subperiosteal abscess and dental caries. Patient seen for same issue 2 days ago. EXAM: CT NECK WITH CONTRAST TECHNIQUE: Multidetector CT imaging of the neck was performed using the standard protocol following the bolus administration of intravenous contrast. CONTRAST:  119mL OMNIPAQUE IOHEXOL 300 MG/ML  SOLN COMPARISON:  CT of the face 11/08/2018 FINDINGS: Pharynx and larynx: There is progressive mass effect from the enlarging subperiosteal abscess about the right mandible. Intrinsic tongue muscles are displaced to the left. There is mild prominence of the adenoid tissue and palatine tonsils, slightly increased from prior study. No focal mucosal or submucosal lesions are present. Epiglottis is within normal limits. Hypopharynx is clear. Vocal cords are midline and symmetric. Salivary glands: There is increased mass effect on the right submandibular gland. Submandibular and parotid glands are otherwise normal. Thyroid: Normal. Lymph nodes: Submental and submandibular lymph nodes are again seen similar the prior study. There is diffuse stranding in the subcutaneous fat over the anterior neck, right greater than left. There is diffuse thickening of the right  platysma muscle. Vascular: Significant vascular disease is present Limited intracranial: Within normal limits Visualized orbits: The globes and orbits are within normal limits. Mastoids and visualized paranasal sinuses: Minimal mucosal thickening is again noted in the inferior maxillary sinuses bilaterally. The paranasal sinuses and mastoid air cells are otherwise clear. Skeleton:  There straightening and some reversal of the normal cervical lordosis. No focal lytic or blastic lesions are present. Upper chest: Lung apices are clear.  Thoracic inlet is normal Other: Large dental caries is again noted in the first right mandibular molar. Extensive periapical disease is present with erosion of the mandible inferiorly and medially. This leads to an enlarging subperiosteal abscess. There is progressive complex thickening along the border of the abscess. The abscess now measures 3.9 x 2.7 x 1.6 cm. It previously measured 3.3 x 1.8 x 0.8 cm. IMPRESSION: 1. Enlarging subperiosteal abscess associated with focal dental caries and periapical abscess of the right first mandibular molar. There is increased thickening at the periphery of the periosteal scratched at there is increased thickening at the periphery of the subperiosteal abscess. The abscess now measures 3.9 x 2.7 x 1.6 cm. 2. Additional dental disease in the left mandible is stable. 3. Extensive soft tissue swelling in the right neck and reactive adenopathy is similar the prior exam. Electronically Signed   By: San Morelle M.D.   On: 11/10/2018 10:46   General appearance: alert, cooperative, mild distress and moderately obese Head: Normocephalic, without obvious abnormality, atraumatic Eyes: negative Nose: Nares normal. Septum midline. Mucosa normal. No drainage or sinus tenderness. Throat: Trismus to approximately 15-20 mm. Unable to visualize pharynx. Decayed molars lower right. No active purulence. Neck: Moderate-severe right submandibular edema with erythema anteriorly. Indurated, non-fluctuant.  Resp: clear to auscultation bilaterally Cardio: regular rate and rhythm, S1, S2 normal, no murmur, click, rub or gallop  Assessment: Right submandibular space abscess secondary to abscessed teeth # 30, 31  Plan: Incision and drainage Right submandibular space abscess, extraction teeth 30, 31. Admission for IV  antibiotics.   Diona Browner 11/10/2018

## 2018-11-10 NOTE — ED Triage Notes (Signed)
Swelling to dental abscess. Has been her in the past week for same problem.  Is currently on abx.

## 2018-11-10 NOTE — Op Note (Signed)
11/10/2018  4:48 PM  PATIENT:  [name]  28 y.o. male  PRE-OPERATIVE DIAGNOSIS:  right submandibular space abscess, non-restorable right lower molars  POST-OPERATIVE DIAGNOSIS:  SAME  PROCEDURE:  Procedure(s): INCISION AND DRAINAGE OF MANDIBLE DENTAL EXTRACTION #30, 31  SURGEON:  Surgeon(s): Diona Browner, DDS  ANESTHESIA:   local and general  EBL:  minimal  DRAINS: 1/4" Penrose right submandibular space x2   SPECIMEN:  No Specimen  COUNTS:  YES  PLAN OF CARE: Discharge to home after PACU  PATIENT DISPOSITION:  Admit for observation   PROCEDURE DETAILS: Dictation #[id number]  Gae Bon, DMD 11/10/2018 4:48 PM

## 2018-11-10 NOTE — ED Notes (Signed)
ED TO INPATIENT HANDOFF REPORT  ED Nurse Name and Phone #: Percell Locus, RN  S Name/Age/Gender [name] 28 y.o. male Room/Bed: 004C/004C  Code Status   Code Status: Not on file  Home/SNF/Other Home Patient oriented to: self, place, time and situation Is this baseline? Yes   Triage Complete: Triage complete  Chief Complaint dental pain   Triage Note Pt transferred from [facility] to see oral surgery for a dental abscess on the right side of his mouth. Pt a.o, airway intact   Allergies Allergies  Allergen Reactions  . Codeine Anaphylaxis  . Penicillins Rash    Has patient had a PCN reaction causing immediate rash, facial/tongue/throat swelling, SOB or lightheadedness with hypotension: No Has patient had a PCN reaction causing severe rash involving mucus membranes or skin necrosis: No Has patient had a PCN reaction that required hospitalization: No Has patient had a PCN reaction occurring within the last 10 years: n  If all of the above answers are "NO", then may proceed with Cephalosporin use.     Level of Care/Admitting Diagnosis ED Disposition    None      B Medical/Surgery History History reviewed. No pertinent past medical history. Past Surgical History:  Procedure Laterality Date  . WISDOM TOOTH EXTRACTION       A IV Location/Drains/Wounds Patient Lines/Drains/Airways Status   Active Line/Drains/Airways    Name:   Placement date:   Placement time:   Site:   Days:   Peripheral IV 11/10/18 Right Antecubital   11/10/18    1417    Antecubital   less than 1          Intake/Output Last 24 hours No intake or output data in the 24 hours ending 11/10/18 1523  Labs/Imaging Results for orders placed or performed during the hospital encounter of 11/10/18 (from the past 48 hour(s))  SARS Coronavirus 2 (CEPHEID - Performed in [facility] hospital lab), Hosp Order     Status: None   Collection Time: 11/10/18  2:06 PM   Specimen: Nasopharyngeal Swab  Result  Value Ref Range   SARS Coronavirus 2 NEGATIVE NEGATIVE    Comment: (NOTE) If result is NEGATIVE SARS-CoV-2 target nucleic acids are NOT DETECTED. The SARS-CoV-2 RNA is generally detectable in upper and lower  respiratory specimens during the acute phase of infection. The lowest  concentration of SARS-CoV-2 viral copies this assay can detect is 250  copies / mL. A negative result does not preclude SARS-CoV-2 infection  and should not be used as the sole basis for treatment or other  patient management decisions.  A negative result may occur with  improper specimen collection / handling, submission of specimen other  than nasopharyngeal swab, presence of viral mutation(s) within the  areas targeted by this assay, and inadequate number of viral copies  (<250 copies / mL). A negative result must be combined with clinical  observations, patient history, and epidemiological information. If result is POSITIVE SARS-CoV-2 target nucleic acids are DETECTED. The SARS-CoV-2 RNA is generally detectable in upper and lower  respiratory specimens dur ing the acute phase of infection.  Positive  results are indicative of active infection with SARS-CoV-2.  Clinical  correlation with patient history and other diagnostic information is  necessary to determine patient infection status.  Positive results do  not rule out bacterial infection or co-infection with other viruses. If result is PRESUMPTIVE POSTIVE SARS-CoV-2 nucleic acids MAY BE PRESENT.   A presumptive positive result was obtained on the  submitted specimen  and confirmed on repeat testing.  While 2019 novel coronavirus  (SARS-CoV-2) nucleic acids may be present in the submitted sample  additional confirmatory testing may be necessary for epidemiological  and / or clinical management purposes  to differentiate between  SARS-CoV-2 and other Sarbecovirus currently known to infect humans.  If clinically indicated additional testing with an  alternate test  methodology [phone]) is advised. The SARS-CoV-2 RNA is generally  detectable in upper and lower respiratory sp ecimens during the acute  phase of infection. The expected result is Negative. Fact Sheet for Patients:  StrictlyIdeas.no Fact Sheet for Healthcare Providers: BankingDealers.co.za This test is not yet approved or cleared by the [place] FDA and has been authorized for detection and/or diagnosis of SARS-CoV-2 by FDA under an Emergency Use Authorization (EUA).  This EUA will remain in effect (meaning this test can be used) for the duration of the COVID-19 declaration under Section 564(b)(1) of the Act, 21 U.S.C. section 360bbb-3(b)(1), unless the authorization is terminated or revoked sooner. Performed at [facility], [address] [place], [place] [address]    Ct Soft Tissue Neck W Contrast  Result Date: 11/10/2018 CLINICAL DATA:  Chin swelling and pain. Redness. Submandibular swelling. Known subperiosteal abscess and dental caries. Patient seen for same issue 2 days ago. EXAM: CT NECK WITH CONTRAST TECHNIQUE: Multidetector CT imaging of the neck was performed using the standard protocol following the bolus administration of intravenous contrast. CONTRAST:  131mL OMNIPAQUE IOHEXOL 300 MG/ML  SOLN COMPARISON:  CT of the face 11/08/2018 FINDINGS: Pharynx and larynx: There is progressive mass effect from the enlarging subperiosteal abscess about the right mandible. Intrinsic tongue muscles are displaced to the left. There is mild prominence of the adenoid tissue and palatine tonsils, slightly increased from prior study. No focal mucosal or submucosal lesions are present. Epiglottis is within normal limits. Hypopharynx is clear. Vocal cords are midline and symmetric. Salivary glands: There is increased mass effect on the right submandibular gland. Submandibular and parotid glands are otherwise normal. Thyroid:  Normal. Lymph nodes: Submental and submandibular lymph nodes are again seen similar the prior study. There is diffuse stranding in the subcutaneous fat over the anterior neck, right greater than left. There is diffuse thickening of the right platysma muscle. Vascular: Significant vascular disease is present Limited intracranial: Within normal limits Visualized orbits: The globes and orbits are within normal limits. Mastoids and visualized paranasal sinuses: Minimal mucosal thickening is again noted in the inferior maxillary sinuses bilaterally. The paranasal sinuses and mastoid air cells are otherwise clear. Skeleton: There straightening and some reversal of the normal cervical lordosis. No focal lytic or blastic lesions are present. Upper chest: Lung apices are clear.  Thoracic inlet is normal Other: Large dental caries is again noted in the first right mandibular molar. Extensive periapical disease is present with erosion of the mandible inferiorly and medially. This leads to an enlarging subperiosteal abscess. There is progressive complex thickening along the border of the abscess. The abscess now measures 3.9 x 2.7 x 1.6 cm. It previously measured 3.3 x 1.8 x 0.8 cm. IMPRESSION: 1. Enlarging subperiosteal abscess associated with focal dental caries and periapical abscess of the right first mandibular molar. There is increased thickening at the periphery of the periosteal scratched at there is increased thickening at the periphery of the subperiosteal abscess. The abscess now measures 3.9 x 2.7 x 1.6 cm. 2. Additional dental disease in the left mandible is stable. 3. Extensive soft tissue  swelling in the right neck and reactive adenopathy is similar the prior exam. Electronically Signed   By: San Morelle M.D.   On: 11/10/2018 10:46    Pending Labs [employer] (From admission, onward)    Start     Ordered   Signed and Held  SARS Coronavirus 2 (Performed in [facility] hospital lab)  (COVID Labs)   Once,   R    Question:  Pre-procedural testing  Answer:  Yes   Signed and Held          Vitals/Pain Today's Vitals   11/10/18 1320 11/10/18 1324  BP: (!) 155/97   Pulse: 88   Resp: 14   Temp: 98.5 F (36.9 C)   SpO2: 97%   PainSc:  10-Worst pain ever    Isolation Precautions No active isolations  Medications Medications  propofol (DIPRIVAN) 10 mg/mL bolus/IV push (has no administration in time range)  fentaNYL (SUBLIMAZE) 250 MCG/5ML injection (has no administration in time range)  midazolam (VERSED) 2 MG/2ML injection (has no administration in time range)  lidocaine 20 MG/ML injection (has no administration in time range)  rocuronium bromide 100 MG/10ML SOSY (has no administration in time range)  lidocaine-EPINEPHrine (XYLOCAINE W/EPI) 2 %-1:100000 (with pres) injection (has no administration in time range)  lidocaine-EPINEPHrine (XYLOCAINE-EPINEPHrine) 1 %-1:200000 (PF) injection (has no administration in time range)    Mobility walks Low fall risk   Focused Assessments    R Recommendations: See Admitting Provider Note  Report given to:   Additional Notes:

## 2018-11-10 NOTE — Progress Notes (Signed)
Patient arrived to 4R84 A&Ox4, VSS, RAC IV intact and infusing.  Noted to have 2 penrose drains present in right lower mandible with kerlix gauze dressing that has drainage present.  Changed dressing to mesh wrap with abd pad.  Patient oriented to room and equipment.  Will continue to monitor.

## 2018-11-11 ENCOUNTER — Encounter (HOSPITAL_COMMUNITY): Payer: Self-pay | Admitting: Oral Surgery

## 2018-11-11 LAB — CBC
HCT: 40 % (ref 39.0–52.0)
Hemoglobin: 13.8 g/dL (ref 13.0–17.0)
MCH: 31.4 pg (ref 26.0–34.0)
MCHC: 34.5 g/dL (ref 30.0–36.0)
MCV: 91.1 fL (ref 80.0–100.0)
Platelets: 299 10*3/uL (ref 150–400)
RBC: 4.39 MIL/uL (ref 4.22–5.81)
RDW: 12.4 % (ref 11.5–15.5)
WBC: 24.3 10*3/uL — ABNORMAL HIGH (ref 4.0–10.5)
nRBC: 0 % (ref 0.0–0.2)

## 2018-11-11 NOTE — Progress Notes (Signed)
Cameron Chavez PROGRESS NOTE:  POD#1  SUBJECTIVE: Feel much better. Able to eat. Minimal pain  OBJECTIVE:  Vitals: Blood pressure 115/88, pulse 61, temperature (!) 97.4 F (36.3 C), temperature source Oral, resp. rate 18, SpO2 99 %. Lab results: Results for orders placed or performed during the hospital encounter of 11/10/18 (from the past 24 hour(s))  SARS Coronavirus 2 (CEPHEID - Performed in [facility] hospital lab), Hosp Order     Status: None   Collection Time: 11/10/18  2:06 PM   Specimen: Nasopharyngeal Swab  Result Value Ref Range   SARS Coronavirus 2 NEGATIVE NEGATIVE  Aerobic/Anaerobic Culture (surgical/deep wound)     Status: None (Preliminary result)   Collection Time: 11/10/18  4:38 PM   Specimen: Abscess  Result Value Ref Range   Specimen Description ABSCESS    Special Requests RIGHT SUBMANDIBULAR    Gram Stain      FEW WBC PRESENT, PREDOMINANTLY PMN MODERATE GRAM NEGATIVE RODS FEW GRAM POSITIVE COCCI Performed at [facility], [place] [address], [place], [place] [address]    Culture PENDING    Report Status PENDING   CBC     Status: Abnormal   Collection Time: 11/11/18  2:51 AM  Result Value Ref Range   WBC 24.3 (H) 4.0 - 10.5 K/uL   RBC 4.39 4.22 - 5.81 MIL/uL   Hemoglobin 13.8 13.0 - 17.0 g/dL   HCT 40.0 39.0 - 52.0 %   MCV 91.1 80.0 - 100.0 fL   MCH 31.4 26.0 - 34.0 pg   MCHC 34.5 30.0 - 36.0 g/dL   RDW 12.4 11.5 - 15.5 %   Platelets 299 150 - 400 K/uL   nRBC 0.0 0.0 - 0.2 %   Radiology Results: Ct Soft Tissue Neck W Contrast  Result Date: 11/10/2018 CLINICAL DATA:  Chin swelling and pain. Redness. Submandibular swelling. Known subperiosteal abscess and dental caries. Patient seen for same issue 2 days ago. EXAM: CT NECK WITH CONTRAST TECHNIQUE: Multidetector CT imaging of the neck was performed using the standard protocol following the bolus administration of intravenous contrast. CONTRAST:  156mL OMNIPAQUE IOHEXOL 300 MG/ML  SOLN COMPARISON:  CT  of the face 11/08/2018 FINDINGS: Pharynx and larynx: There is progressive mass effect from the enlarging subperiosteal abscess about the right mandible. Intrinsic tongue muscles are displaced to the left. There is mild prominence of the adenoid tissue and palatine tonsils, slightly increased from prior study. No focal mucosal or submucosal lesions are present. Epiglottis is within normal limits. Hypopharynx is clear. Vocal cords are midline and symmetric. Salivary glands: There is increased mass effect on the right submandibular gland. Submandibular and parotid glands are otherwise normal. Thyroid: Normal. Lymph nodes: Submental and submandibular lymph nodes are again seen similar the prior study. There is diffuse stranding in the subcutaneous fat over the anterior neck, right greater than left. There is diffuse thickening of the right platysma muscle. Vascular: Significant vascular disease is present Limited intracranial: Within normal limits Visualized orbits: The globes and orbits are within normal limits. Mastoids and visualized paranasal sinuses: Minimal mucosal thickening is again noted in the inferior maxillary sinuses bilaterally. The paranasal sinuses and mastoid air cells are otherwise clear. Skeleton: There straightening and some reversal of the normal cervical lordosis. No focal lytic or blastic lesions are present. Upper chest: Lung apices are clear.  Thoracic inlet is normal Other: Large dental caries is again noted in the first right mandibular molar. Extensive periapical disease is present with erosion of  the mandible inferiorly and medially. This leads to an enlarging subperiosteal abscess. There is progressive complex thickening along the border of the abscess. The abscess now measures 3.9 x 2.7 x 1.6 cm. It previously measured 3.3 x 1.8 x 0.8 cm. IMPRESSION: 1. Enlarging subperiosteal abscess associated with focal dental caries and periapical abscess of the right first mandibular molar. There is  increased thickening at the periphery of the periosteal scratched at there is increased thickening at the periphery of the subperiosteal abscess. The abscess now measures 3.9 x 2.7 x 1.6 cm. 2. Additional dental disease in the left mandible is stable. 3. Extensive soft tissue swelling in the right neck and reactive adenopathy is similar the prior exam. Electronically Signed   By: San Morelle M.D.   On: 11/10/2018 10:46   General appearance: alert, cooperative and no distress Head: Normocephalic, without obvious abnormality, atraumatic Eyes: negative Nose: Nares normal. Septum midline. Mucosa normal. No drainage or sinus tenderness. Throat: Trismus to 15-40mm. Extraction sites hemostatic, sutures intact. Pharynx clear.  Neck: Moderate edema right submandibular area. Drains intact, minimal drainage.  Resp: clear to auscultation bilaterally Cardio: regular rate and rhythm, S1, S2 normal, no murmur, click, rub or gallop  ASSESSMENT: Stable s/p I&D right submandibular space, extraction teeth.  PLAN: continue IV antibiotics. Saline rinses. Ambulate.   Cameron Chavez 11/11/2018

## 2018-11-12 ENCOUNTER — Encounter (HOSPITAL_COMMUNITY): Payer: Self-pay | Admitting: Oral Surgery

## 2018-11-12 LAB — CBC
HCT: 37.7 % — ABNORMAL LOW (ref 39.0–52.0)
Hemoglobin: 12.7 g/dL — ABNORMAL LOW (ref 13.0–17.0)
MCH: 31 pg (ref 26.0–34.0)
MCHC: 33.7 g/dL (ref 30.0–36.0)
MCV: 92 fL (ref 80.0–100.0)
Platelets: 302 10*3/uL (ref 150–400)
RBC: 4.1 MIL/uL — ABNORMAL LOW (ref 4.22–5.81)
RDW: 12.6 % (ref 11.5–15.5)
WBC: 16.7 10*3/uL — ABNORMAL HIGH (ref 4.0–10.5)
nRBC: 0 % (ref 0.0–0.2)

## 2018-11-12 MED ORDER — CLINDAMYCIN HCL 300 MG PO CAPS: 300.0000 mg | ORAL_CAPSULE | Freq: Three times a day (TID) | ORAL | 0 refills | Status: DC

## 2018-11-12 MED ORDER — ACETAMINOPHEN 500 MG PO TABS: 1000.0000 mg | ORAL_TABLET | Freq: Four times a day (QID) | ORAL | 0 refills | Status: AC

## 2018-11-12 NOTE — Discharge Summary (Signed)
Patient ID: Cameron Chavez MRN: [id number] DOB/AGE: 09/15/1990 28 y.o.  Admit date: 11/10/2018 Discharge date: 11/12/2018  Admission Diagnoses: Right submandibular space abscess, abscessed teeth Discharge Diagnoses: Same Active Problems:   Submandibular swelling   Discharged Condition: good  Hospital Course: Underwent surgery 11/10/2018. Received IV antibiotics post-operatively  Consults: None  Significant Diagnostic Studies: labs: CBC, CHem21, microbiology: wound culture: positive and radiology: CT scan: Neck with contrast  Treatments: IV hydration, antibiotics: Clindamycin and analgesia: oxycodone  Discharge Exam: Blood pressure 111/65, pulse (!) 41, temperature 97.9 F (36.6 C), temperature source Oral, resp. rate 14, SpO2 98 %. General appearance: alert, cooperative and no distress Head: Normocephalic, without obvious abnormality, atraumatic Eyes: negative Nose: Nares normal. Septum midline. Mucosa normal. No drainage or sinus tenderness. Throat: lips, mucosa, and tongue normal; teeth and gums normal and Improved oral opening to 40mm. Pharynx clear. Extraction sites hemostatic, no purulence.  Neck: Moderate induration right submandibular. Mild drainage on bandages. Resp: clear to auscultation bilaterally Cardio: regular rate and rhythm, S1, S2 normal, no murmur, click, rub or gallop  Disposition: Discharge disposition: 01-Home or Self Care       Discharge Instructions    Call MD for:  difficulty breathing, headache or visual disturbances   Complete by: As directed    Call MD for:  persistant nausea and vomiting   Complete by: As directed    Call MD for:  severe uncontrolled pain   Complete by: As directed    Call MD for:  temperature >100.4   Complete by: As directed    Diet - low sodium heart healthy   Complete by: As directed    Soft Diet. Advance as tolerated.   Discharge instructions   Complete by: As directed    Keep dressing dry. Change as needed. Warm  salt water rinses 2-3 times per day. Soft diet.   Increase activity slowly   Complete by: As directed      Allergies as of 11/12/2018      Reactions   Codeine Anaphylaxis   Nickel Rash   RASH, THEN SITE WEEPS AND PEELS OPEN   Other Anaphylaxis, Diarrhea, Swelling   TREE NUTS   Penicillins Rash   Has patient had a PCN reaction causing immediate rash, facial/tongue/throat swelling, SOB or lightheadedness with hypotension: Rash Has patient had a PCN reaction causing severe rash involving mucus membranes or skin necrosis: No Has patient had a PCN reaction that required hospitalization: No Has patient had a PCN reaction occurring within the last 10 years: No If all of the above answers are "NO", then may proceed with Cephalosporin use.      Medication List    STOP taking these medications   GOODY HEADACHE PO     TAKE these medications   acetaminophen 500 MG tablet Commonly known as: TYLENOL Take 2 tablets (1,000 mg total) by mouth every 6 (six) hours.   clindamycin 300 MG capsule Commonly known as: CLEOCIN Take 1 capsule (300 mg total) by mouth 3 (three) times daily. What changed:   medication strength  when to take this   diphenhydrAMINE 25 MG tablet Commonly known as: BENADRYL Take 25 mg by mouth every 6 (six) hours as needed for itching.   fluticasone 50 MCG/ACT nasal spray Commonly known as: FLONASE Place 1-2 sprays into both nostrils daily as needed for allergies or rhinitis.   ibuprofen 800 MG tablet Commonly known as: ADVIL Take 1 tablet (800 mg total) by mouth every 6 (six) hours as needed  for moderate pain.   loratadine 10 MG tablet Commonly known as: CLARITIN Take 10 mg by mouth daily as needed for allergies.   Melatonin 1 MG Tabs Take 2 mg by mouth at bedtime as needed (for sleep).   naproxen 250 MG tablet Commonly known as: NAPROSYN Take 1 tablet (250 mg total) by mouth 2 (two) times daily as needed for mild pain or moderate pain (take with food).    nicotine 7 mg/24hr patch Commonly known as: NICODERM CQ - dosed in mg/24 hr Place 7 mg onto the skin daily.        Signed: Diona Browner 11/12/2018, 7:44 AM

## 2018-11-12 NOTE — TOC Transition Note (Signed)
Transition of Care [facility]) - CM/SW Discharge Note   Patient Details  Name: Cameron Chavez MRN: [id number] Date of Birth: 05-25-1990  [facility] CM/SW Contact:  Marilu Favre, RN Phone Number: 11/12/2018, 10:33 AM   Clinical Narrative:     Spoke to patient , explained [facility] program , patient declined at this time states he can get prescription filled.   Final next level of care: Home/Self Care Barriers to Discharge: No Barriers Identified   Patient Goals and CMS Choice Patient states their goals for this hospitalization and ongoing recovery are:: to go home CMS Medicare.gov Compare Post Acute Care list provided to:: Patient Choice offered to / list presented to : NA  Discharge Placement                       Discharge Plan and Services In-house Referral: Financial Counselor Discharge Planning Services: CM Consult, [facility] Program Post Acute Care Choice: NA          DME Arranged: N/A         HH Arranged: NA          Social Determinants of Health (SDOH) Interventions     Readmission Risk Interventions No flowsheet data found.

## 2018-11-12 NOTE — Plan of Care (Signed)
  Problem: Nutrition: Goal: Adequate nutrition will be maintained Outcome: Progressing   Problem: Elimination: Goal: Will not experience complications related to bowel motility Outcome: Progressing   Problem: Clinical Measurements: Goal: Ability to maintain clinical measurements within normal limits will improve Outcome: Progressing   Problem: Pain Managment: Goal: General experience of comfort will improve Outcome: Progressing

## 2018-11-12 NOTE — Discharge Instructions (Signed)
Note to Employer  Cameron Chavez had surgery for jaw abscess on 11/10/2018. He was hospitalized and discharged today. He will require follow-up on 11/15/2018 in  My office. He will be assessed for return to work at that time. If you have any questions, please do not hesitate to call the office.   Diona Browner, DMD [phone]

## 2018-11-12 NOTE — Progress Notes (Signed)
Patient discharged to home. Verbalizes understanding of all discharge instructions including incision care, discharge medications, and follow up MD visits. Patient provided with supplies to change dressing as needed.

## 2018-11-12 NOTE — Plan of Care (Signed)
  Problem: Clinical Measurements: Goal: Ability to maintain clinical measurements within normal limits will improve Outcome: Progressing   Problem: Coping: Goal: Level of anxiety will decrease Outcome: Progressing   Problem: Activity: Goal: Risk for activity intolerance will decrease Outcome: Progressing

## 2018-11-14 LAB — AEROBIC/ANAEROBIC CULTURE W GRAM STAIN (SURGICAL/DEEP WOUND)

## 2019-01-10 ENCOUNTER — Other Ambulatory Visit: Payer: Self-pay

## 2019-01-10 DIAGNOSIS — Z20822 Contact with and (suspected) exposure to covid-19: Secondary | ICD-10-CM

## 2019-01-11 ENCOUNTER — Other Ambulatory Visit: Payer: Self-pay

## 2019-01-11 ENCOUNTER — Ambulatory Visit
Admission: EM | Admit: 2019-01-11 | Discharge: 2019-01-11 | Disposition: A | Discharge status: Home / Self Care | Payer: BC Managed Care – PPO | Attending: Emergency Medicine | Admitting: Emergency Medicine

## 2019-01-11 DIAGNOSIS — Z20828 Contact with and (suspected) exposure to other viral communicable diseases: Secondary | ICD-10-CM

## 2019-01-11 DIAGNOSIS — R110 Nausea: Secondary | ICD-10-CM | POA: Diagnosis not present

## 2019-01-11 DIAGNOSIS — R197 Diarrhea, unspecified: Secondary | ICD-10-CM

## 2019-01-11 DIAGNOSIS — R6889 Other general symptoms and signs: Secondary | ICD-10-CM

## 2019-01-11 DIAGNOSIS — R05 Cough: Secondary | ICD-10-CM | POA: Diagnosis not present

## 2019-01-11 DIAGNOSIS — Z20822 Contact with and (suspected) exposure to covid-19: Secondary | ICD-10-CM

## 2019-01-11 MED ORDER — ONDANSETRON 4 MG PO TBDP
4.0000 mg | ORAL_TABLET | Freq: Once | ORAL | Status: AC
  Administered 2019-01-11: 11:00:00 4 mg via ORAL

## 2019-01-11 MED ORDER — CETIRIZINE-PSEUDOEPHEDRINE ER 5-120 MG PO TB12: 1.0000 | ORAL_TABLET | Freq: Every day | ORAL | 0 refills | Status: AC

## 2019-01-11 MED ORDER — FLUTICASONE PROPIONATE 50 MCG/ACT NA SUSP: 2.0000 | Freq: Every day | NASAL | 0 refills | Status: AC

## 2019-01-11 NOTE — Discharge Instructions (Signed)
Patient had COVID testing done yesterday.  Pending results Zofran given in office  In the meantime: You should remain isolated in your home for 10 days from symptom onset AND greater than 72 hours after symptoms resolution (absence of fever without the use of fever-reducing medication and improvement in respiratory symptoms), whichever is longer Get plenty of rest and push fluids Zyrtec-D prescribed for nasal congestion, runny nose, and/or sore throat Flonase prescribed for nasal congestion and runny nose Use medications daily for symptom relief Use OTC medications like ibuprofen or tylenol as needed fever or pain Call or go to the ED if you have any new or worsening symptoms such as fever, worsening cough, shortness of breath, chest tightness, chest pain, turning blue, changes in mental status, etc..Marland Kitchen

## 2019-01-11 NOTE — ED Provider Notes (Signed)
[place]   [id number] 01/11/19 Arrival Time: [id number]   CC: flu like symptoms; concern for COVID  SUBJECTIVE: History from: patient.  Cameron Chavez is a 28 y.o. male who presents with nausea, few episodes of vomiting, >10 episodes of watery diarrhea (more formed now), productive cough with green sputum, sinus congestion/ pressure, and fever, with tmax of 101, that began 2 days ago.  States overall his symptoms are improving.  Denies sick exposure to COVID, flu or strep.  Denies recent travel.  Has tried ibuprofen with relief.  Denies aggravating factors.  Reports previous symptoms in the past related to food poisoning and sinus infection, but never had symptoms occur at the same time.   Denies rhinorrhea, sore throat, SOB, wheezing, chest pain, changes in bladder habits.    ROS: As per HPI.  All other pertinent ROS negative.     History reviewed. No pertinent past medical history. Past Surgical History:  Procedure Laterality Date  . INCISION AND DRAINAGE ABSCESS Right 11/10/2018   Procedure: INCISION AND DRAINAGE OF MANDIBLE UNDER ANESTHESIA;  Surgeon: Diona Browner, DDS;  Location: [facility];  Service: Oral Surgery;  Laterality: Right;  . TOOTH EXTRACTION N/A 11/10/2018   Procedure: DENTAL RESTORATION/EXTRACTIONS;  Surgeon: Diona Browner, DDS;  Location: [facility];  Service: Oral Surgery;  Laterality: N/A;  . WISDOM TOOTH EXTRACTION     Allergies  Allergen Reactions  . Codeine Anaphylaxis  . Nickel Rash    RASH, THEN SITE WEEPS AND PEELS OPEN  . Other Anaphylaxis, Diarrhea and Swelling    TREE NUTS  . Penicillins Rash    Has patient had a PCN reaction causing immediate rash, facial/tongue/throat swelling, SOB or lightheadedness with hypotension: Rash Has patient had a PCN reaction causing severe rash involving mucus membranes or skin necrosis: No Has patient had a PCN reaction that required hospitalization: No Has patient had a PCN reaction occurring within the last 10 years: No   If all of the above answers are "NO", then may proceed with Cephalosporin use.    No current facility-administered medications on file prior to encounter.    Current Outpatient Medications on File Prior to Encounter  Medication Sig Dispense Refill  . ibuprofen (ADVIL) 800 MG tablet Take 1 tablet (800 mg total) by mouth every 6 (six) hours as needed for moderate pain. 20 tablet 0  . nicotine (NICODERM CQ - DOSED IN MG/24 HR) 7 mg/24hr patch Place 7 mg onto the skin daily.    Marland Kitchen acetaminophen (TYLENOL) 500 MG tablet Take 2 tablets (1,000 mg total) by mouth every 6 (six) hours. 30 tablet 0  . diphenhydrAMINE (BENADRYL) 25 MG tablet Take 25 mg by mouth every 6 (six) hours as needed for itching.    . loratadine (CLARITIN) 10 MG tablet Take 10 mg by mouth daily as needed for allergies.    . Melatonin 1 MG TABS Take 2 mg by mouth at bedtime as needed (for sleep).    . naproxen (NAPROSYN) 250 MG tablet Take 1 tablet (250 mg total) by mouth 2 (two) times daily as needed for mild pain or moderate pain (take with food). 14 tablet 0   Social History   Socioeconomic History  . Marital status: Single    Spouse name: Not on file  . Number of children: Not on file  . Years of education: Not on file  . Highest education level: Not on file  Occupational History  . Not on file  Social Needs  . Financial  resource strain: Not on file  . Food insecurity    Worry: Not on file    Inability: Not on file  . Transportation needs    Medical: Not on file    Non-medical: Not on file  Tobacco Use  . Smoking status: Current Every Day Smoker    Packs/day: 0.50    Years: 10.00    Pack years: 5.00    Types: Cigarettes  . Smokeless tobacco: Never Used  Substance and Sexual Activity  . Alcohol use: No  . Drug use: No  . Sexual activity: Yes  Lifestyle  . Physical activity    Days per week: Not on file    Minutes per session: Not on file  . Stress: Not on file  Relationships  . Social Product manager on phone: Not on file    Gets together: Not on file    Attends religious service: Not on file    Active member of club or organization: Not on file    Attends meetings of clubs or organizations: Not on file    Relationship status: Not on file  . Intimate partner violence    Fear of current or ex partner: Not on file    Emotionally abused: Not on file    Physically abused: Not on file    Forced sexual activity: Not on file  Other Topics Concern  . Not on file  Social History Narrative  . Not on file   Family History  Problem Relation Age of Onset  . Diabetes Other   . Heart disease Father     OBJECTIVE:  Vitals:   01/11/19 1036  BP: 133/78  Pulse: (!) 51  Resp: 20  Temp: 97.8 F (36.6 C)  SpO2: 97%     General appearance: alert; appears mildly fatigued, but nontoxic; speaking in full sentences and tolerating own secretions HEENT: NCAT; Ears: EACs clear, TMs pearly gray; Eyes: PERRL.  EOM grossly intact. Sinuses: TTP over maxillary sinuses; Nose: nares patent without rhinorrhea, Throat: oropharynx clear, tonsils non erythematous or enlarged, uvula midline  Neck: supple without LAD Lungs: unlabored respirations, symmetrical air entry; cough: absent; no respiratory distress; CTAB Heart: regular rate and rhythm.  Radial pulses 2+ symmetrical bilaterally Abdomen: soft, nondistended, normal active bowel sounds; nontender to palpation; no guarding  Skin: warm and dry Psychological: alert and cooperative; normal mood and affect  ASSESSMENT & PLAN:  1. Suspected Covid-19 Virus Infection   2. Flu-like symptoms     Meds ordered this encounter  Medications  . ondansetron (ZOFRAN-ODT) disintegrating tablet 4 mg  . cetirizine-pseudoephedrine (ZYRTEC-D) 5-120 MG tablet    Sig: Take 1 tablet by mouth daily.    Dispense:  30 tablet    Refill:  0    Order Specific Question:   Supervising Provider    Answer:   Raylene Everts [id number]  . fluticasone (FLONASE) 50  MCG/ACT nasal spray    Sig: Place 2 sprays into both nostrils daily.    Dispense:  16 g    Refill:  0    Order Specific Question:   Supervising Provider    Answer:   Raylene Everts [id number]    Patient had COVID testing done yesterday.  Pending results Zofran given in office  In the meantime: You should remain isolated in your home for 10 days from symptom onset AND greater than 72 hours after symptoms resolution (absence of fever without the use of fever-reducing medication and improvement in  respiratory symptoms), whichever is longer Get plenty of rest and push fluids Zyrtec-D prescribed for nasal congestion, runny nose, and/or sore throat Flonase prescribed for nasal congestion and runny nose Use medications daily for symptom relief Use OTC medications like ibuprofen or tylenol as needed fever or pain Call or go to the ED if you have any new or worsening symptoms such as fever, worsening cough, shortness of breath, chest tightness, chest pain, turning blue, changes in mental status, etc...   Reviewed expectations re: course of current medical issues. Questions answered. Outlined signs and symptoms indicating need for more acute intervention. Patient verbalized understanding. After Visit Summary given.         Lestine Box, PA-C 01/11/19 1100

## 2019-01-11 NOTE — ED Triage Notes (Signed)
Pt has headache, diarrhea and vomiting that began 2 days ago, symptoms improving a little, tested for covid yesterday, results pending

## 2019-01-12 LAB — NOVEL CORONAVIRUS, NAA: SARS-CoV-2, NAA: NOT DETECTED

## 2019-05-14 ENCOUNTER — Other Ambulatory Visit: Payer: Self-pay

## 2019-05-14 ENCOUNTER — Ambulatory Visit: Payer: BC Managed Care – PPO | Attending: Internal Medicine

## 2019-05-14 DIAGNOSIS — Z20822 Contact with and (suspected) exposure to covid-19: Secondary | ICD-10-CM

## 2019-05-15 LAB — NOVEL CORONAVIRUS, NAA: SARS-CoV-2, NAA: NOT DETECTED

## 2019-06-28 ENCOUNTER — Ambulatory Visit
Admission: EM | Admit: 2019-06-28 | Discharge: 2019-06-28 | Disposition: A | Discharge status: Home / Self Care | Payer: BC Managed Care – PPO | Attending: Emergency Medicine | Admitting: Emergency Medicine

## 2019-06-28 ENCOUNTER — Other Ambulatory Visit: Payer: Self-pay

## 2019-06-28 DIAGNOSIS — R200 Anesthesia of skin: Secondary | ICD-10-CM

## 2019-06-28 MED ORDER — PREDNISONE 10 MG PO TABS: 20.0000 mg | ORAL_TABLET | Freq: Every day | ORAL | 0 refills | Status: DC

## 2019-06-28 MED ORDER — GABAPENTIN 100 MG PO CAPS: 100.0000 mg | ORAL_CAPSULE | Freq: Three times a day (TID) | ORAL | 0 refills | Status: AC

## 2019-06-28 NOTE — Discharge Instructions (Addendum)
Take gabapentin and short-term prednisone as prescribed Follow-up with primary care to get a referral to specialist if needed Return or go to ED for worsening of symptoms

## 2019-06-28 NOTE — ED Provider Notes (Signed)
[facility]    CSN: [id number] Arrival date & time: 06/28/19  [id number]      History   Chief Complaint Chief Complaint  Patient presents with  . Leg Pain    HPI Cameron Chavez is a 29 y.o. male.   Who presented to urgent care for complaint of right anterior upper thigh numbness for the past 2 weeks.  Patient stated he developed area of skin change above his right knee for the past couple days.  Denies any precipitating event.  Reported numbness to the right anterior  upper thigh.  Has not used any medication.  Denies similar symptoms in the past.  Denies chills, fever, nausea, vomiting, diarrhea, chest pain, chest tightness, confusion, diplopia, blurry vision, headache, back pain.  The history is provided by the patient. No language interpreter was used.    History reviewed. No pertinent past medical history.  Patient Active Problem List   Diagnosis Date Noted  . Submandibular swelling 11/10/2018    Past Surgical History:  Procedure Laterality Date  . INCISION AND DRAINAGE ABSCESS Right 11/10/2018   Procedure: INCISION AND DRAINAGE OF MANDIBLE UNDER ANESTHESIA;  Surgeon: Diona Browner, DDS;  Location: [facility];  Service: Oral Surgery;  Laterality: Right;  . TOOTH EXTRACTION N/A 11/10/2018   Procedure: DENTAL RESTORATION/EXTRACTIONS;  Surgeon: Diona Browner, DDS;  Location: [facility];  Service: Oral Surgery;  Laterality: N/A;  . WISDOM TOOTH EXTRACTION         Home Medications    Prior to Admission medications   Medication Sig Start Date End Date Taking? Authorizing Provider  acetaminophen (TYLENOL) 500 MG tablet Take 2 tablets (1,000 mg total) by mouth every 6 (six) hours. 11/12/18   Diona Browner, DDS  cetirizine-pseudoephedrine (ZYRTEC-D) 5-120 MG tablet Take 1 tablet by mouth daily. 01/11/19   Wurst, [place], PA-C  diphenhydrAMINE (BENADRYL) 25 MG tablet Take 25 mg by mouth every 6 (six) hours as needed for itching.    [provider]  fluticasone (FLONASE)  50 MCG/ACT nasal spray Place 2 sprays into both nostrils daily. 01/11/19   Wurst, [place], PA-C  gabapentin (NEURONTIN) 100 MG capsule Take 1 capsule (100 mg total) by mouth 3 (three) times daily. 06/28/19   Keiasia Christianson, Darrelyn Hillock, FNP  ibuprofen (ADVIL) 800 MG tablet Take 1 tablet (800 mg total) by mouth every 6 (six) hours as needed for moderate pain. 11/08/18   Orpah Greek, MD  loratadine (CLARITIN) 10 MG tablet Take 10 mg by mouth daily as needed for allergies.    [provider]  Melatonin 1 MG TABS Take 2 mg by mouth at bedtime as needed (for sleep).    [provider]  naproxen (NAPROSYN) 250 MG tablet Take 1 tablet (250 mg total) by mouth 2 (two) times daily as needed for mild pain or moderate pain (take with food). 09/30/18   Francine Graven, DO  nicotine (NICODERM CQ - DOSED IN MG/24 HR) 7 mg/24hr patch Place 7 mg onto the skin daily.    [provider]  predniSONE (DELTASONE) 10 MG tablet Take 2 tablets (20 mg total) by mouth daily. 06/28/19   Emerson Monte, FNP    Family History Family History  Problem Relation Age of Onset  . Diabetes Other   . Heart disease Father     Social History Social History   Tobacco Use  . Smoking status: Current Every Day Smoker    Packs/day: 0.50    Years: 10.00    Pack years:  5.00    Types: Cigarettes  . Smokeless tobacco: Never Used  Substance Use Topics  . Alcohol use: No  . Drug use: No     Allergies   Codeine, Nickel, Other, and Penicillins   Review of Systems Review of Systems  Constitutional: Negative.   Respiratory: Negative.   Cardiovascular: Negative.   Musculoskeletal: Negative.   Neurological: Positive for numbness.     Physical Exam Triage Vital Signs ED Triage Vitals  Enc Vitals Group     BP 06/28/19 0936 120/82     Pulse Rate 06/28/19 0936 (!) 56     Resp 06/28/19 0936 18     Temp 06/28/19 0936 98.1 F (36.7 C)     Temp src --      SpO2 06/28/19 0936 96 %     Weight --       Height --      Head Circumference --      Peak Flow --      Pain Score 06/28/19 0935 4     Pain Loc --      Pain Edu? --      Excl. in [place]? --    No data found.  Updated Vital Signs BP 120/82   Pulse (!) 56   Temp 98.1 F (36.7 C)   Resp 18   SpO2 96%   Visual Acuity Right Eye Distance:   Left Eye Distance:   Bilateral Distance:    Right Eye Near:   Left Eye Near:    Bilateral Near:     Physical Exam Vitals and nursing note reviewed.  Constitutional:      General: He is not in acute distress.    Appearance: Normal appearance. He is normal weight. He is not ill-appearing, toxic-appearing or diaphoretic.  Cardiovascular:     Rate and Rhythm: Normal rate and regular rhythm.     Pulses: Normal pulses.     Heart sounds: Normal heart sounds.  Pulmonary:     Effort: Pulmonary effort is normal. No respiratory distress.     Breath sounds: Normal breath sounds. No stridor. No wheezing, rhonchi or rales.  Chest:     Chest wall: No tenderness.  Musculoskeletal:        General: No swelling, tenderness, deformity or signs of injury. Normal range of motion.     Right lower leg: No edema.     Left lower leg: No edema.  Neurological:     General: No focal deficit present.     Mental Status: He is alert and oriented to person, place, and time.     Cranial Nerves: Cranial nerves are intact. No cranial nerve deficit.     Sensory: Sensory deficit present.     Motor: Motor function is intact. No weakness, tremor, atrophy, abnormal muscle tone, seizure activity or pronator drift.     Coordination: Coordination is intact.     Gait: Gait is intact.     Comments: Sensory deficit on the right upper thigh      UC Treatments / Results  Labs (all labs ordered are listed, but only abnormal results are displayed) Labs Reviewed - No data to display  EKG   Radiology No results found.  Procedures Procedures (including critical care time)  Medications Ordered in  UC Medications - No data to display  Initial Impression / Assessment and Plan / UC Course  I have reviewed the triage vital signs and the nursing notes.  Pertinent labs & imaging results that  were available during my care of the patient were reviewed by me and considered in my medical decision making (see chart for details).    Patient is stable for discharge. Advised patient to follow-up with primary care for referral to neurology Gabapentin was prescribed Short-term prednisone was prescribed Return for worsening of symptoms  Final Clinical Impressions(s) / UC Diagnoses   Final diagnoses:  Numbness of right anterior thigh     Discharge Instructions     Take gabapentin and short-term prednisone as prescribed Follow-up with primary care to get a referral to specialist if needed Return or go to ED for worsening of symptoms      ED Prescriptions    Medication Sig Dispense Auth. Provider   gabapentin (NEURONTIN) 100 MG capsule Take 1 capsule (100 mg total) by mouth 3 (three) times daily. 60 capsule Sparrow Sanzo S, FNP   predniSONE (DELTASONE) 10 MG tablet Take 2 tablets (20 mg total) by mouth daily. 15 tablet Thailan Sava, Darrelyn Hillock, FNP     PDMP not reviewed this encounter.   Emerson Monte, FNP 06/28/19 1038

## 2019-06-28 NOTE — ED Triage Notes (Signed)
Pt states that right upper thigh has been numb for past 2 weeks and the developed bruising just below area of numbness within past couple days

## 2019-11-18 ENCOUNTER — Ambulatory Visit: Payer: BC Managed Care – PPO | Admitting: Urology

## 2019-11-23 ENCOUNTER — Ambulatory Visit: Payer: Self-pay

## 2019-12-06 ENCOUNTER — Ambulatory Visit: Payer: Self-pay

## 2019-12-06 ENCOUNTER — Ambulatory Visit (INDEPENDENT_AMBULATORY_CARE_PROVIDER_SITE_OTHER)
Admission: EM | Admit: 2019-12-06 | Discharge: 2019-12-06 | Disposition: A | Discharge status: Home / Self Care | Payer: BC Managed Care – PPO | Source: Home / Self Care

## 2019-12-06 ENCOUNTER — Ambulatory Visit
Admission: RE | Admit: 2019-12-06 | Discharge: 2019-12-06 | Disposition: A | Discharge status: Home / Self Care | Payer: BC Managed Care – PPO | Source: Ambulatory Visit | Attending: Family Medicine | Admitting: Family Medicine

## 2019-12-06 ENCOUNTER — Other Ambulatory Visit: Payer: Self-pay

## 2019-12-06 ENCOUNTER — Encounter: Payer: Self-pay | Admitting: Emergency Medicine

## 2019-12-06 DIAGNOSIS — Z113 Encounter for screening for infections with a predominantly sexual mode of transmission: Secondary | ICD-10-CM | POA: Diagnosis present

## 2019-12-06 DIAGNOSIS — R300 Dysuria: Secondary | ICD-10-CM | POA: Insufficient documentation

## 2019-12-06 LAB — POCT URINALYSIS DIP (MANUAL ENTRY)
Bilirubin, UA: NEGATIVE
Blood, UA: NEGATIVE
Glucose, UA: 100 mg/dL — AB
Ketones, POC UA: NEGATIVE mg/dL
Leukocytes, UA: NEGATIVE
Nitrite, UA: POSITIVE — AB
Protein Ur, POC: NEGATIVE mg/dL
Spec Grav, UA: 1.025 (ref 1.010–1.025)
Urobilinogen, UA: 0.2 E.U./dL
pH, UA: 5 (ref 5.0–8.0)

## 2019-12-06 MED ORDER — NITROFURANTOIN MONOHYD MACRO 100 MG PO CAPS: 100.0000 mg | ORAL_CAPSULE | Freq: Two times a day (BID) | ORAL | 0 refills | Status: AC

## 2019-12-06 NOTE — ED Provider Notes (Signed)
[place]   [id number] 12/06/19 Arrival Time: 25   CC: CONCERN FOR STD  SUBJECTIVE:  Cameron Chavez is a 29 y.o. male who presents requesting STI screening.  Complains of dysuria x 1 day.  Last unprotected sex 2 weeks ago.  Took AZO without relief.  Sexually active with 1 male partner over the past 6 months.  Denies similar symptoms in the past.  Denies fever, chills, nausea, vomiting, abdominal or pelvic pain, penile rashes or lesions, testicular swelling or pain.     ROS: As per HPI.  All other pertinent ROS negative.     History reviewed. No pertinent past medical history. Past Surgical History:  Procedure Laterality Date  . INCISION AND DRAINAGE ABSCESS Right 11/10/2018   Procedure: INCISION AND DRAINAGE OF MANDIBLE UNDER ANESTHESIA;  Surgeon: Diona Browner, DDS;  Location: [facility];  Service: Oral Surgery;  Laterality: Right;  . TOOTH EXTRACTION N/A 11/10/2018   Procedure: DENTAL RESTORATION/EXTRACTIONS;  Surgeon: Diona Browner, DDS;  Location: [place] [facility];  Service: Oral Surgery;  Laterality: N/A;  . WISDOM TOOTH EXTRACTION     Allergies  Allergen Reactions  . Codeine Anaphylaxis  . Nickel Rash    RASH, THEN SITE WEEPS AND PEELS OPEN  . Other Anaphylaxis, Diarrhea and Swelling    TREE NUTS  . Penicillins Rash    Has patient had a PCN reaction causing immediate rash, facial/tongue/throat swelling, SOB or lightheadedness with hypotension: Rash Has patient had a PCN reaction causing severe rash involving mucus membranes or skin necrosis: No Has patient had a PCN reaction that required hospitalization: No Has patient had a PCN reaction occurring within the last 10 years: No  If all of the above answers are "NO", then may proceed with Cephalosporin use.    No current facility-administered medications on file prior to encounter.   Current Outpatient Medications on File Prior to Encounter  Medication Sig Dispense Refill  . acetaminophen (TYLENOL) 500 MG tablet Take 2  tablets (1,000 mg total) by mouth every 6 (six) hours. 30 tablet 0  . cetirizine-pseudoephedrine (ZYRTEC-D) 5-120 MG tablet Take 1 tablet by mouth daily. 30 tablet 0  . diphenhydrAMINE (BENADRYL) 25 MG tablet Take 25 mg by mouth every 6 (six) hours as needed for itching.    . fluticasone (FLONASE) 50 MCG/ACT nasal spray Place 2 sprays into both nostrils daily. 16 g 0  . gabapentin (NEURONTIN) 100 MG capsule Take 1 capsule (100 mg total) by mouth 3 (three) times daily. 60 capsule 0  . ibuprofen (ADVIL) 800 MG tablet Take 1 tablet (800 mg total) by mouth every 6 (six) hours as needed for moderate pain. 20 tablet 0  . loratadine (CLARITIN) 10 MG tablet Take 10 mg by mouth daily as needed for allergies.    . Melatonin 1 MG TABS Take 2 mg by mouth at bedtime as needed (for sleep).    . nicotine (NICODERM CQ - DOSED IN MG/24 HR) 7 mg/24hr patch Place 7 mg onto the skin daily.     Social History   Socioeconomic History  . Marital status: Single    Spouse name: Not on file  . Number of children: Not on file  . Years of education: Not on file  . Highest education level: Not on file  Occupational History  . Not on file  Tobacco Use  . Smoking status: Current Every Day Smoker    Packs/day: 0.50    Years: 10.00    Pack years: 5.00  Types: Cigarettes  . Smokeless tobacco: Never Used  Vaping Use  . Vaping Use: Never used  Substance and Sexual Activity  . Alcohol use: No  . Drug use: No  . Sexual activity: Yes  Other Topics Concern  . Not on file  Social History Narrative  . Not on file   Social Determinants of Health   Financial Resource Strain:   . Difficulty of Paying Living Expenses:   Food Insecurity:   . Worried About Charity fundraiser in the Last Year:   . Arboriculturist in the Last Year:   Transportation Needs:   . Film/video editor (Medical):   Marland Kitchen Lack of Transportation (Non-Medical):   Physical Activity:   . Days of Exercise per Week:   . Minutes of Exercise per  Session:   Stress:   . Feeling of Stress :   Social Connections:   . Frequency of Communication with Friends and Family:   . Frequency of Social Gatherings with Friends and Family:   . Attends Religious Services:   . Active Member of Clubs or Organizations:   . Attends Archivist Meetings:   Marland Kitchen Marital Status:   Intimate Partner Violence:   . Fear of Current or Ex-Partner:   . Emotionally Abused:   Marland Kitchen Physically Abused:   . Sexually Abused:    Family History  Problem Relation Age of Onset  . Diabetes Other   . Heart disease Father     OBJECTIVE:  Vitals:   12/06/19 1413  BP: (!) 155/91  Pulse: 89  Resp: 17  Temp: 98.6 F (37 C)  TempSrc: Oral  SpO2: 97%     General appearance: alert, NAD, appears stated age Head: NCAT Throat: lips, mucosa, and tongue normal; teeth and gums normal Lungs: CTA bilaterally without adventitious breath sounds Heart: regular rate and rhythm.   Abdomen: soft, non-tender; bowel sounds normal; no guarding GU: Chaperon present: urethral swab obtained Skin: warm and dry Psychological:  Alert and cooperative. Normal mood and affect.  LABS:  Results for orders placed or performed during the hospital encounter of 12/06/19  POCT urinalysis dipstick  Result Value Ref Range   Color, UA orange (A) yellow   Clarity, UA clear clear   Glucose, UA =100 (A) negative mg/dL   Bilirubin, UA negative negative   Ketones, POC UA negative negative mg/dL   Spec Grav, UA 1.025 1.010 - 1.025   Blood, UA negative negative   pH, UA 5.0 5.0 - 8.0   Protein Ur, POC negative negative mg/dL   Urobilinogen, UA 0.2 0.2 or 1.0 E.U./dL   Nitrite, UA Positive (A) Negative   Leukocytes, UA Negative Negative    Labs Reviewed  POCT URINALYSIS DIP (MANUAL ENTRY) - Abnormal; Notable for the following components:      Result Value   Color, UA orange (*)    Glucose, UA =100 (*)    Nitrite, UA Positive (*)    All other components within normal limits  URINE  CULTURE  RPR  HIV ANTIBODY (ROUTINE TESTING W REFLEX)  CYTOLOGY, (ORAL, ANAL, URETHRAL) ANCILLARY ONLY    ASSESSMENT & PLAN:  1. Dysuria   2. Screening examination for venereal disease     Meds ordered this encounter  Medications  . nitrofurantoin, macrocrystal-monohydrate, (MACROBID) 100 MG capsule    Sig: Take 1 capsule (100 mg total) by mouth 2 (two) times daily.    Dispense:  10 capsule    Refill:  0  Order Specific Question:   Supervising Provider    Answer:   Raylene Everts [[id number]]    Pending: Labs Reviewed  POCT URINALYSIS DIP (MANUAL ENTRY) - Abnormal; Notable for the following components:      Result Value   Color, UA orange (*)    Glucose, UA =100 (*)    Nitrite, UA Positive (*)    All other components within normal limits  URINE CULTURE  RPR  HIV ANTIBODY (ROUTINE TESTING W REFLEX)  CYTOLOGY, (ORAL, ANAL, URETHRAL) ANCILLARY ONLY    Urethral swab obtained HIV/ syphilis testing today Urine concerning for UTI.  Urine culture sent.  We will follow up with you regarding the results of your test If tests are positive, please abstain from sexual activity until you and your partner(s) are treated Follow up with PCP if symptoms persists Return here or go to ER if you have any new or worsening symptoms fever, chills, nausea, vomiting, abdominal pain, changes in bowel or bladder function, etc...  Reviewed expectations re: course of current medical issues. Questions answered. Outlined signs and symptoms indicating need for more acute intervention. Patient verbalized understanding. After Visit Summary given.       Lestine Box, PA-C 12/06/19 1434

## 2019-12-06 NOTE — Discharge Instructions (Signed)
Urethral swab obtained HIV/ syphilis testing today Urine concerning for UTI.  Urine culture sent.  We will follow up with you regarding the results of your test If tests are positive, please abstain from sexual activity until you and your partner(s) are treated Follow up with PCP if symptoms persists Return here or go to ER if you have any new or worsening symptoms fever, chills, nausea, vomiting, abdominal pain, changes in bowel or bladder function, etc..Marland Kitchen

## 2019-12-06 NOTE — ED Triage Notes (Signed)
Provider triage  

## 2019-12-07 LAB — HIV ANTIBODY (ROUTINE TESTING W REFLEX): HIV Screen 4th Generation wRfx: NONREACTIVE

## 2019-12-07 LAB — RPR: RPR Ser Ql: NONREACTIVE

## 2019-12-08 LAB — URINE CULTURE: Culture: NO GROWTH

## 2019-12-09 LAB — CYTOLOGY, (ORAL, ANAL, URETHRAL) ANCILLARY ONLY
Chlamydia: NEGATIVE
Comment: NEGATIVE
Comment: NEGATIVE
Comment: NORMAL
Neisseria Gonorrhea: NEGATIVE
Trichomonas: NEGATIVE

## 2019-12-26 ENCOUNTER — Ambulatory Visit: Payer: BC Managed Care – PPO | Admitting: Urology

## 2020-07-15 IMAGING — CT CT NECK WITH CONTRAST
3 series · 12 of 33 positions shown, 14 images · IV contrast (omnipaque)
Comparison: CT of the face 11/08/2018

CLINICAL DATA: Chin swelling and pain. Redness. Submandibular
swelling. Known subperiosteal abscess and dental caries. Patient
seen for same issue 2 days ago.

EXAM:
CT NECK WITH CONTRAST
TECHNIQUE: Multidetector CT imaging of the neck was performed using the
standard protocol following the bolus administration of intravenous
contrast.
CONTRAST:  100mL OMNIPAQUE IOHEXOL 300 MG/ML  SOLN

[Series 2: axial neck · axial · 0.46mm/px · z∈[+261,+441]mm · 4 of 132 slices shown, 5 images]
[im 21/132  soft-tissue]
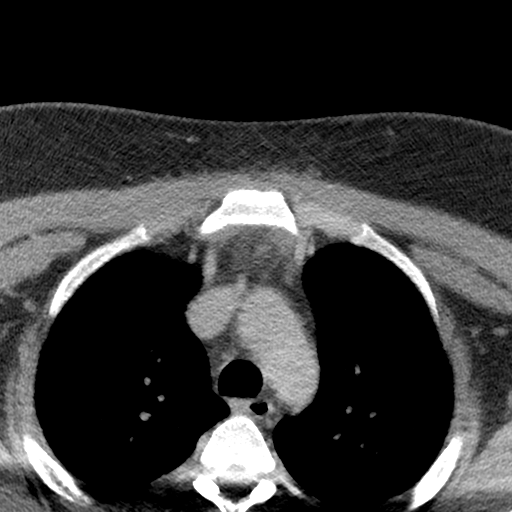
[im 21/132  bone]
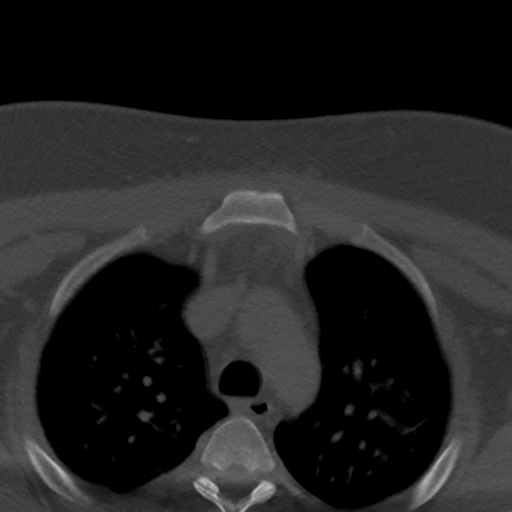
[im 51/132  bone]
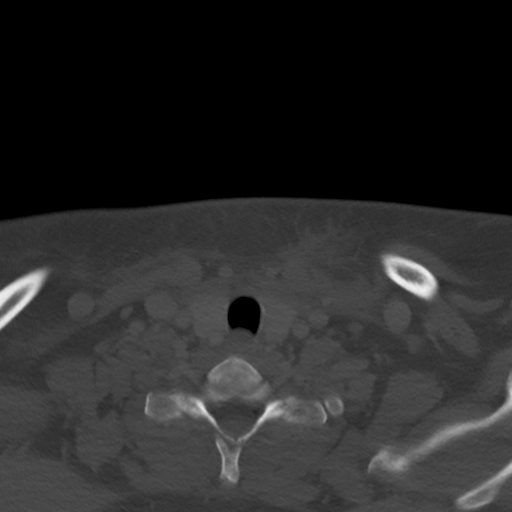
[im 81/132  bone]
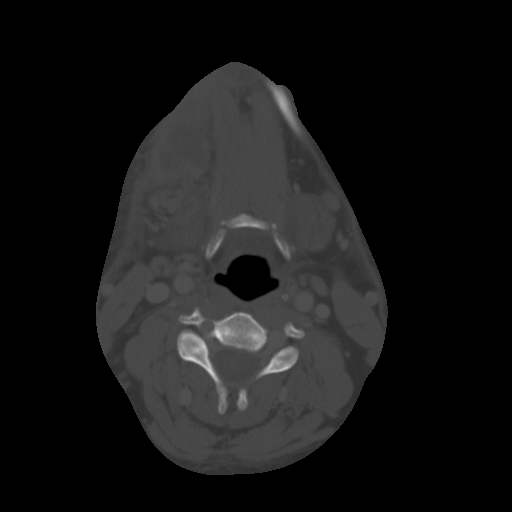
[im 111/132  bone]
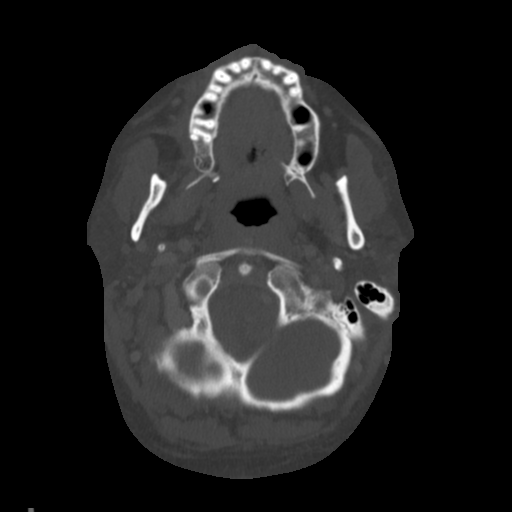

[Series 4: cor neck · coronal · 0.33mm/px · 3 of 101 slices shown]
[im 21/101  bone]
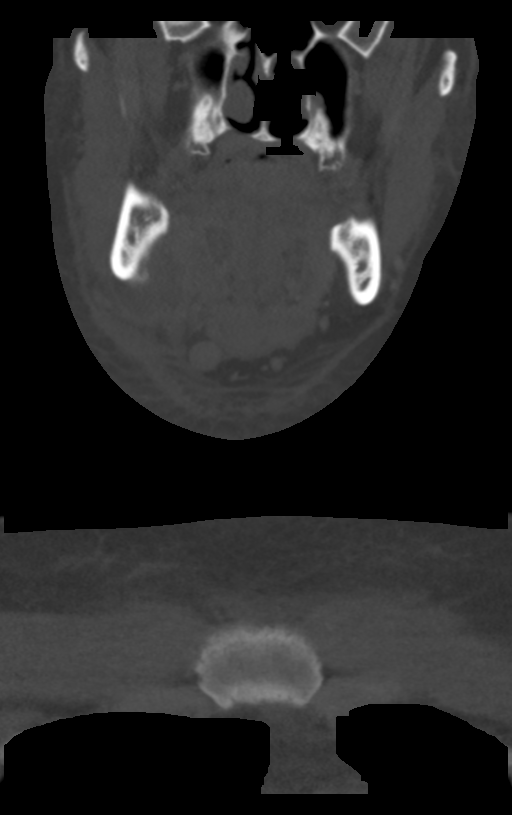
[im 41/101  bone]
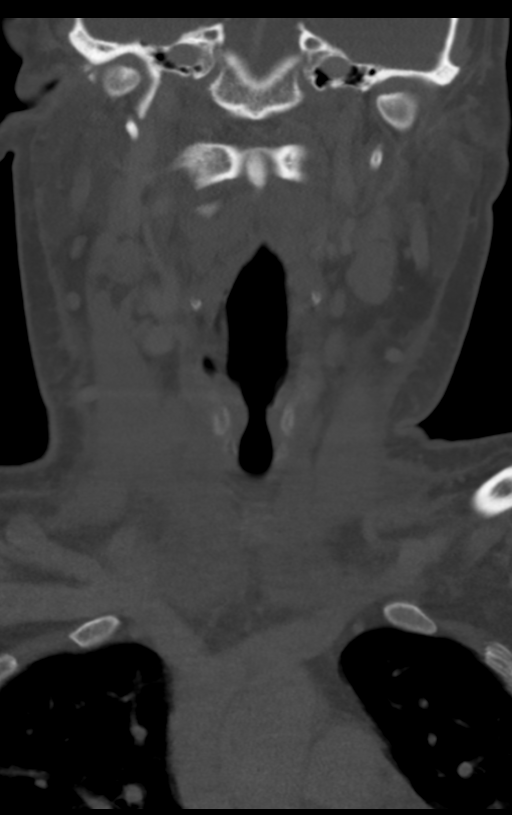
[im 61/101  bone]
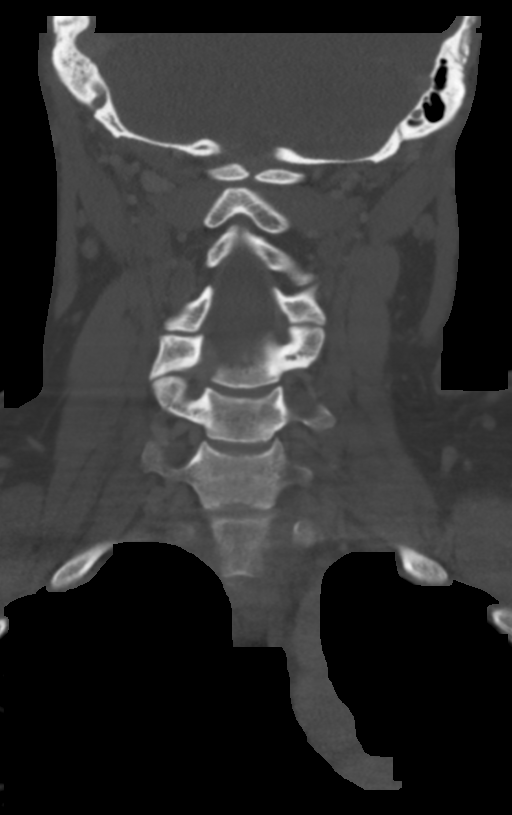

[Series 5: sag neck · sagittal · 0.37mm/px · 5 of 101 slices shown, 6 images]
[im 34/101  bone]
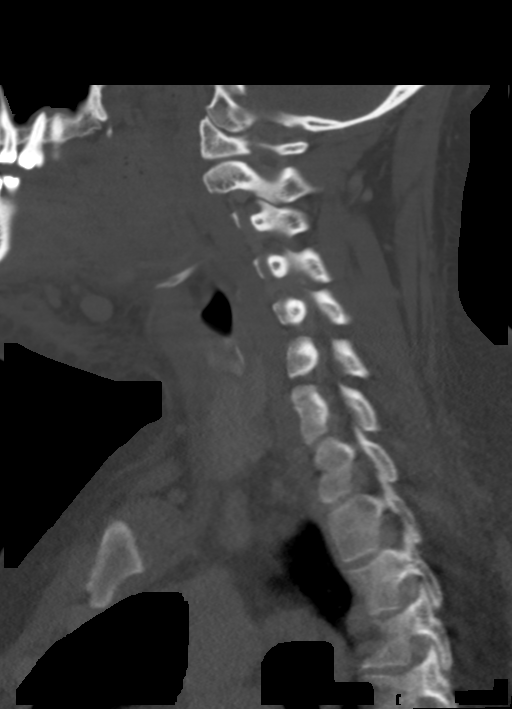
[im 42/101  bone]
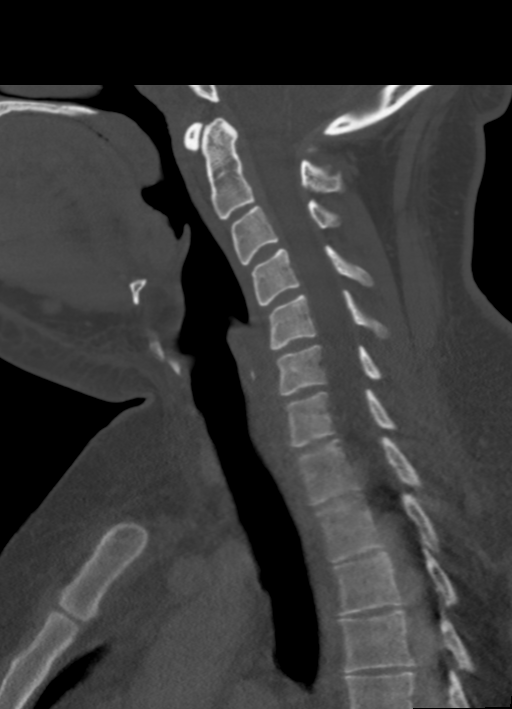
[im 51/101  soft-tissue]
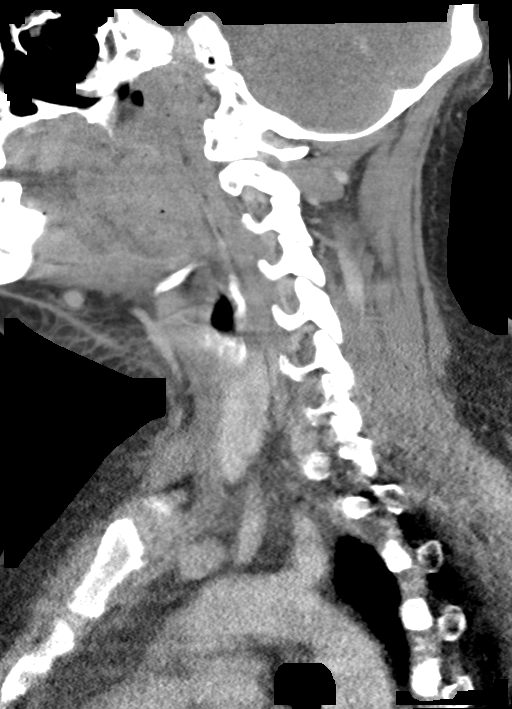
[im 51/101  bone]
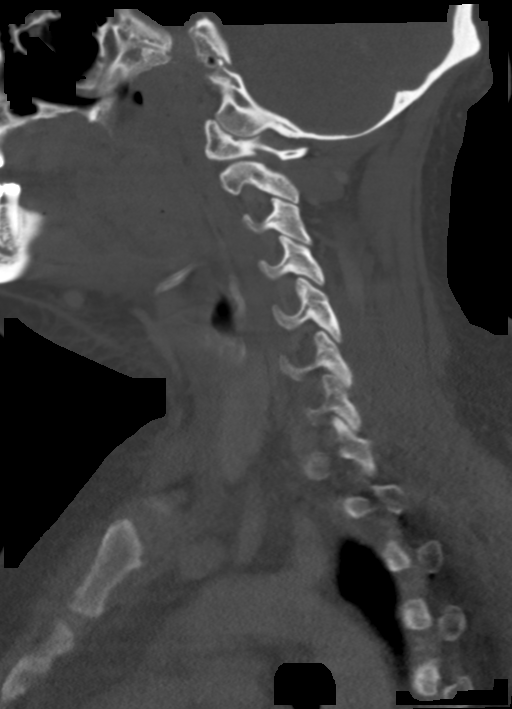
[im 59/101  bone]
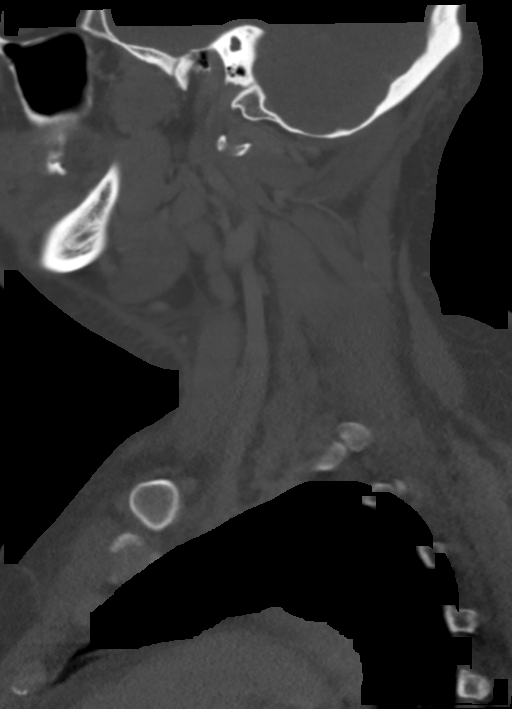
[im 67/101  bone]
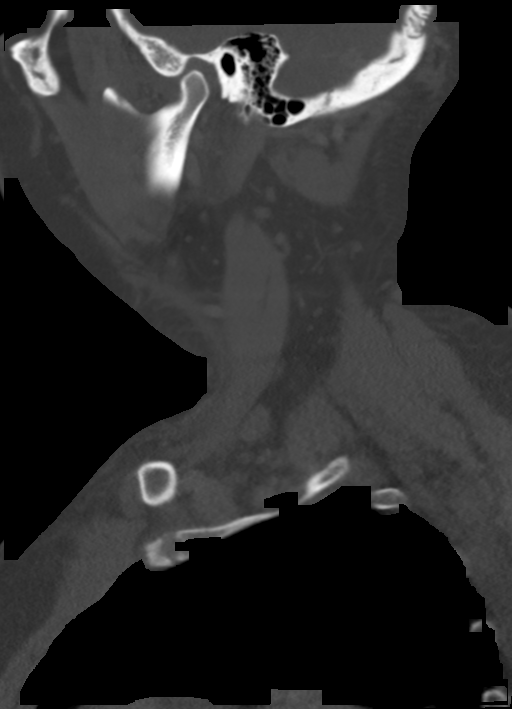

[12 of 33 positions shown; findings below may reference images not displayed]

FINDINGS: Pharynx and larynx: There is progressive mass effect from the
enlarging subperiosteal abscess about the right mandible. Intrinsic
tongue muscles are displaced to the left. There is mild prominence
of the adenoid tissue and palatine tonsils, slightly increased from
prior study. No focal mucosal or submucosal lesions are present.
Epiglottis is within normal limits. Hypopharynx is clear. Vocal
cords are midline and symmetric.

Salivary glands: There is increased mass effect on the right
submandibular gland. Submandibular and parotid glands are otherwise
normal.

Thyroid: Normal.

Lymph nodes: Submental and submandibular lymph nodes are again seen
similar the prior study. There is diffuse stranding in the
subcutaneous fat over the anterior neck, right greater than left.
There is diffuse thickening of the right platysma muscle.

Vascular: Significant vascular disease is present

Limited intracranial: Within normal limits

Visualized orbits: The globes and orbits are within normal limits.

Mastoids and visualized paranasal sinuses: Minimal mucosal
thickening is again noted in the inferior maxillary sinuses
bilaterally. The paranasal sinuses and mastoid air cells are
otherwise clear.

Skeleton: There straightening and some reversal of the normal
cervical lordosis. No focal lytic or blastic lesions are present.

Upper chest: Lung apices are clear.  Thoracic inlet is normal

Other: Large dental caries is again noted in the first right
mandibular molar. Extensive periapical disease is present with
erosion of the mandible inferiorly and medially. This leads to an
enlarging subperiosteal abscess. There is progressive complex
thickening along the border of the abscess. The abscess now measures
3.9 x 2.7 x 1.6 cm. It previously measured 3.3 x 1.8 x 0.8 cm.
IMPRESSION: 1. Enlarging subperiosteal abscess associated with focal dental
caries and periapical abscess of the right first mandibular molar.
There is increased thickening at the periphery of the periosteal
scratched at there is increased thickening at the periphery of the
subperiosteal abscess. The abscess now measures 3.9 x 2.7 x 1.6 cm.
2. Additional dental disease in the left mandible is stable.
3. Extensive soft tissue swelling in the right neck and reactive
adenopathy is similar the prior exam.

## 2021-08-11 ENCOUNTER — Telehealth: Payer: BC Managed Care – PPO | Admitting: Family Medicine

## 2021-08-11 DIAGNOSIS — K0889 Other specified disorders of teeth and supporting structures: Secondary | ICD-10-CM

## 2021-08-12 MED ORDER — NAPROXEN 500 MG PO TABS: 500.0000 mg | ORAL_TABLET | Freq: Two times a day (BID) | ORAL | 0 refills | Status: AC

## 2021-08-12 MED ORDER — CLINDAMYCIN HCL 300 MG PO CAPS: 300.0000 mg | ORAL_CAPSULE | Freq: Three times a day (TID) | ORAL | 0 refills | Status: AC

## 2021-08-12 NOTE — Progress Notes (Signed)
E-Visit for Dental Pain ? ?We are sorry that you are not feeling well.  Here is how we plan to help! ? ?Based on what you have shared with me in the questionnaire, it sounds like you have dental infection ? ?naprosyn '500mg'$  2 times per day for 7 days for discomfort ?Clindamycin 300 mg 3 times a day for 10 days. ? ?It is imperative that you see a dentist within 10 days of this eVisit to determine the cause of the dental pain and be sure it is adequately treated ? ?A toothache or tooth pain is caused when the nerve in the root of a tooth or surrounding a tooth is irritated. Dental (tooth) infection, decay, injury, or loss of a tooth are the most common causes of dental pain. Pain may also occur after an extraction (tooth is pulled out). Pain sometimes originates from other areas and radiates to the jaw, thus appearing to be tooth pain.Bacteria growing inside your mouth can contribute to gum disease and dental decay, both of which can cause pain. A toothache occurs from inflammation of the central portion of the tooth called pulp. The pulp contains nerve endings that are very sensitive to pain. Inflammation to the pulp or pulpitis may be caused by dental cavities, trauma, and infection.  ? ? ?HOME CARE:  ? ?For toothaches: ?Over-the-counter pain medications such as acetaminophen or ibuprofen may be used. Take these as directed on the package while you arrange for a dental appointment. ?Avoid very cold or hot foods, because they may make the pain worse. ?You may get relief from biting on a cotton ball soaked in oil of cloves. You can get oil of cloves at most drug stores. ? ?For jaw pain: ? Aspirin may be helpful for problems in the joint of the jaw in adults. ?If pain happens every time you open your mouth widely, the temporomandibular joint (TMJ) may be the source of the pain. Yawning or taking a large bite of food may worsen the pain. An appointment with your doctor or dentist will help you find the cause. ?  ? ? ?GET  HELP RIGHT AWAY IF: ? ?You have a high fever or chills ?If you have had a recent head or face injury and develop headache, light headedness, nausea, vomiting, or other symptoms that concern you after an injury to your face or mouth, you could have a more serious injury in addition to your dental injury. ?A facial rash associated with a toothache: This condition may improve with medication. Contact your doctor for them to decide what is appropriate. ?Any jaw pain occurring with chest pain: Although jaw pain is most commonly caused by dental disease, it is sometimes referred pain from other areas. People with heart disease, especially people who have had stents placed, people with diabetes, or those who have had heart surgery may have jaw pain as a symptom of heart attack or angina. If your jaw or tooth pain is associated with lightheadedness, sweating, or shortness of breath, you should see a doctor as soon as possible. ?Trouble swallowing or excessive pain or bleeding from gums: If you have a history of a weakened immune system, diabetes, or steroid use, you may be more susceptible to infections. Infections can often be more severe and extensive or caused by unusual organisms. Dental and gum infections in people with these conditions may require more aggressive treatment. An abscess may need draining or IV antibiotics, for example. ? ?MAKE SURE YOU  ? ?Understand these instructions. ?  Will watch your condition. ?Will get help right away if you are not doing well or get worse. ? ?Thank you for choosing an e-visit. ? ?Your e-visit answers were reviewed by a board certified advanced clinical practitioner to complete your personal care plan. Depending upon the condition, your plan could have included both over the counter or prescription medications. ? ?Please review your pharmacy choice. Make sure the pharmacy is open so you can pick up prescription now. If there is a problem, you may contact your provider through  [employer] and have the prescription routed to another pharmacy.  Your safety is important to [place]. If you have drug allergies check your prescription carefully.  ? ?For the next 24 hours you can use MyChart to ask questions about today's visit, request a non-urgent call back, or ask for a work or school excuse. ?You will get an email in the next two days asking about your experience. I hope that your e-visit has been valuable and will speed your recovery. ? ?I provided 5 minutes of non face-to-face time during this encounter for chart review, medication and order placement, as well as and documentation.  ? ?

## 2021-10-20 ENCOUNTER — Telehealth: Payer: Self-pay | Admitting: Nurse Practitioner

## 2021-10-20 DIAGNOSIS — K047 Periapical abscess without sinus: Secondary | ICD-10-CM

## 2021-10-20 MED ORDER — CLINDAMYCIN HCL 300 MG PO CAPS: 300.0000 mg | ORAL_CAPSULE | Freq: Three times a day (TID) | ORAL | 0 refills | Status: AC

## 2021-10-20 NOTE — Patient Instructions (Signed)
   It is imperative that you see a dentist within 10 days of this Virtual Visit to determine the cause of the dental pain and be sure it is adequately treated  A toothache or tooth pain is caused when the nerve in the root of a tooth or surrounding a tooth is irritated. Dental (tooth) infection, decay, injury, or loss of a tooth are the most common causes of dental pain. Pain may also occur after an extraction (tooth is pulled out). Pain sometimes originates from other areas and radiates to the jaw, thus appearing to be tooth pain.Bacteria growing inside your mouth can contribute to gum disease and dental decay, both of which can cause pain. A toothache occurs from inflammation of the central portion of the tooth called pulp. The pulp contains nerve endings that are very sensitive to pain. Inflammation to the pulp or pulpitis may be caused by dental cavities, trauma, and infection.    HOME CARE:   For toothaches: Over-the-counter pain medications such as acetaminophen or ibuprofen may be used. Take these as directed on the package while you arrange for a dental appointment. Avoid very cold or hot foods, because they may make the pain worse. You may get relief from biting on a cotton ball soaked in oil of cloves. You can get oil of cloves at most drug stores.  For jaw pain:  Aspirin may be helpful for problems in the joint of the jaw in adults. If pain happens every time you open your mouth widely, the temporomandibular joint (TMJ) may be the source of the pain. Yawning or taking a large bite of food may worsen the pain. An appointment with your doctor or dentist will help you find the cause.     GET HELP RIGHT AWAY IF:  You have a high fever or chills If you have had a recent head or face injury and develop headache, light headedness, nausea, vomiting, or other symptoms that concern you after an injury to your face or mouth, you could have a more serious injury in addition to your dental  injury. A facial rash associated with a toothache: This condition may improve with medication. Contact your doctor for them to decide what is appropriate. Any jaw pain occurring with chest pain: Although jaw pain is most commonly caused by dental disease, it is sometimes referred pain from other areas. People with heart disease, especially people who have had stents placed, people with diabetes, or those who have had heart surgery may have jaw pain as a symptom of heart attack or angina. If your jaw or tooth pain is associated with lightheadedness, sweating, or shortness of breath, you should see a doctor as soon as possible. Trouble swallowing or excessive pain or bleeding from gums: If you have a history of a weakened immune system, diabetes, or steroid use, you may be more susceptible to infections. Infections can often be more severe and extensive or caused by unusual organisms. Dental and gum infections in people with these conditions may require more aggressive treatment. An abscess may need draining or IV antibiotics, for example.  MAKE SURE YOU   Understand these instructions. Will watch your condition. Will get help right away if you are not doing well or get worse.

## 2021-10-20 NOTE — Progress Notes (Signed)
Virtual Visit Consent   Cameron Chavez, you are scheduled for a virtual visit with a [facility] provider today. Just as with appointments in the office, your consent must be obtained to participate. Your consent will be active for this visit and any virtual visit you may have with one of our providers in the next 365 days. If you have a MyChart account, a copy of this consent can be sent to you electronically.  As this is a virtual visit, video technology does not allow for your provider to perform a traditional examination. This may limit your provider's ability to fully assess your condition. If your provider identifies any concerns that need to be evaluated in person or the need to arrange testing (such as labs, EKG, etc.), we will make arrangements to do so. Although advances in technology are sophisticated, we cannot ensure that it will always work on either your end or our end. If the connection with a video visit is poor, the visit may have to be switched to a telephone visit. With either a video or telephone visit, we are not always able to ensure that we have a secure connection.  By engaging in this virtual visit, you consent to the provision of healthcare and authorize for your insurance to be billed (if applicable) for the services provided during this visit. Depending on your insurance coverage, you may receive a charge related to this service.  I need to obtain your verbal consent now. Are you willing to proceed with your visit today? Cameron Chavez has provided verbal consent on 10/20/2021 for a virtual visit (video or telephone). Cameron Schneiders, FNP  Date: 10/20/2021 6:43 PM  Virtual Visit via Video Note   I, Cameron Chavez, connected with  Cameron Chavez  ([id number], 03-Jun-1990) on 10/20/21 at  7:15 PM EDT by a video-enabled telemedicine application and verified that I am speaking with the correct person using two identifiers.  Location: Patient: Virtual Visit Location Patient:  Home Provider: Virtual Visit Location Provider: Home Office   I discussed the limitations of evaluation and management by telemedicine and the availability of in person appointments. The patient expressed understanding and agreed to proceed.    History of Present Illness: Cameron Chavez is a 31 y.o. who identifies as a male who was assigned male at birth, and is being seen today with complaints of infected teeth tonight. He has been trying to see a dentist but is currently without insurance.  He has had infected teeth in the past, he did have an appointment with an oral surgeon but had to pay out of pocket and was unable to have procedure done.   Denies a fever.  Problems:  Patient Active Problem List   Diagnosis Date Noted   Submandibular swelling 11/10/2018    Allergies  Allergen Reactions   Codeine Anaphylaxis   Nickel Rash    RASH, THEN SITE WEEPS AND PEELS OPEN   Other Anaphylaxis, Diarrhea and Swelling    TREE NUTS   Penicillins Rash    Has patient had a PCN reaction causing immediate rash, facial/tongue/throat swelling, SOB or lightheadedness with hypotension: Rash Has patient had a PCN reaction causing severe rash involving mucus membranes or skin necrosis: No Has patient had a PCN reaction that required hospitalization: No Has patient had a PCN reaction occurring within the last 10 years: No  If all of the above answers are "NO", then may proceed with Cephalosporin use.    Sulfamethoxazole-Trimethoprim Rash  Medications:  Current Outpatient Medications:    acetaminophen (TYLENOL) 500 MG tablet, Take 2 tablets (1,000 mg total) by mouth every 6 (six) hours., Disp: 30 tablet, Rfl: 0   cetirizine-pseudoephedrine (ZYRTEC-D) 5-120 MG tablet, Take 1 tablet by mouth daily., Disp: 30 tablet, Rfl: 0   diphenhydrAMINE (BENADRYL) 25 MG tablet, Take 25 mg by mouth every 6 (six) hours as needed for itching., Disp: , Rfl:    fluticasone (FLONASE) 50 MCG/ACT nasal spray, Place 2  sprays into both nostrils daily., Disp: 16 g, Rfl: 0   gabapentin (NEURONTIN) 100 MG capsule, Take 1 capsule (100 mg total) by mouth 3 (three) times daily., Disp: 60 capsule, Rfl: 0   ibuprofen (ADVIL) 800 MG tablet, Take 1 tablet (800 mg total) by mouth every 6 (six) hours as needed for moderate pain., Disp: 20 tablet, Rfl: 0   loratadine (CLARITIN) 10 MG tablet, Take 10 mg by mouth daily as needed for allergies., Disp: , Rfl:    Melatonin 1 MG TABS, Take 2 mg by mouth at bedtime as needed (for sleep)., Disp: , Rfl:    nicotine (NICODERM CQ - DOSED IN MG/24 HR) 7 mg/24hr patch, Place 7 mg onto the skin daily., Disp: , Rfl:    nitrofurantoin, macrocrystal-monohydrate, (MACROBID) 100 MG capsule, Take 1 capsule (100 mg total) by mouth 2 (two) times daily., Disp: 10 capsule, Rfl: 0  Observations/Objective: Patient is well-developed, well-nourished in no acute distress.  Resting comfortably at home.  Head is normocephalic, atraumatic.  No labored breathing.  Speech is clear and coherent with logical content.  Patient is alert and oriented at baseline.   1. Tooth infection Meds ordered this encounter  Medications   clindamycin (CLEOCIN) 300 MG capsule    Sig: Take 1 capsule (300 mg total) by mouth 3 (three) times daily for 10 days.    Dispense:  30 capsule    Refill:  0         Follow Up Instructions: I discussed the assessment and treatment plan with the patient. The patient was provided an opportunity to ask questions and all were answered. The patient agreed with the plan and demonstrated an understanding of the instructions.  A copy of instructions were sent to the patient via MyChart unless otherwise noted below.    The patient was advised to call back or seek an in-person evaluation if the symptoms worsen or if the condition fails to improve as anticipated.  Time:  I spent 10 minutes with the patient via telehealth technology discussing the above problems/concerns.    Cameron Schneiders, FNP

## 2022-05-23 ENCOUNTER — Telehealth: Payer: Self-pay | Admitting: Physician Assistant

## 2022-05-23 DIAGNOSIS — T7840XA Allergy, unspecified, initial encounter: Secondary | ICD-10-CM

## 2022-05-23 NOTE — Progress Notes (Signed)
Virtual Visit Consent   Cameron Chavez, you are scheduled for a virtual visit with a [facility] provider today. Just as with appointments in the office, your consent must be obtained to participate. Your consent will be active for this visit and any virtual visit you may have with one of our providers in the next 365 days. If you have a MyChart account, a copy of this consent can be sent to you electronically.  As this is a virtual visit, video technology does not allow for your provider to perform a traditional examination. This may limit your provider's ability to fully assess your condition. If your provider identifies any concerns that need to be evaluated in person or the need to arrange testing (such as labs, EKG, etc.), we will make arrangements to do so. Although advances in technology are sophisticated, we cannot ensure that it will always work on either your end or our end. If the connection with a video visit is poor, the visit may have to be switched to a telephone visit. With either a video or telephone visit, we are not always able to ensure that we have a secure connection.  By engaging in this virtual visit, you consent to the provision of healthcare and authorize for your insurance to be billed (if applicable) for the services provided during this visit. Depending on your insurance coverage, you may receive a charge related to this service.  I need to obtain your verbal consent now. Are you willing to proceed with your visit today? CHOICE KLEINSASSER has provided verbal consent on 05/23/2022 for a virtual visit (video or telephone). Mar Daring, PA-C  Date: 05/23/2022 4:54 PM  Virtual Visit via Video Note   I, Mar Daring, connected with  Cameron Chavez  ([id number], 08-30-1990) on 05/23/22 at  3:45 PM EST by a video-enabled telemedicine application and verified that I am speaking with the correct person using two identifiers.  Location: Patient: Virtual Visit Location  Patient: Home Provider: Virtual Visit Location Provider: Home Office   I discussed the limitations of evaluation and management by telemedicine and the availability of in person appointments. The patient expressed understanding and agreed to proceed.    History of Present Illness: Cameron Chavez is a 32 y.o. who identifies as a male who was assigned male at birth, and is being seen today for rash. Having new rash on stomach following ER visit yesterday for allergic reaction to suspected Mucinex.  Was seen in person at [facility] for dehydration and allergic reaction. He was given IV fluids, 2 different steroids, IV benadryl. Sent home with Prednisone '20mg'$  Take 2 tablets ('40mg'$ ) for 5 days and Famotidine '20mg'$  daily for 5 days.   He had some mild rash yesterday, but reports the rash progressed on his abdomen today. He denies any severe itching. Having small, erythematous flat papular rash diffuse on stomach.   Problems:  Patient Active Problem List   Diagnosis Date Noted   Submandibular swelling 11/10/2018    Allergies:  Allergies  Allergen Reactions   Codeine Anaphylaxis   Nickel Rash    RASH, THEN SITE WEEPS AND PEELS OPEN   Other Anaphylaxis, Diarrhea and Swelling    TREE NUTS   Guaifenesin Rash   Penicillins Rash    Has patient had a PCN reaction causing immediate rash, facial/tongue/throat swelling, SOB or lightheadedness with hypotension: Rash Has patient had a PCN reaction causing severe rash involving mucus membranes or skin necrosis: No Has  patient had a PCN reaction that required hospitalization: No Has patient had a PCN reaction occurring within the last 10 years: No  If all of the above answers are "NO", then may proceed with Cephalosporin use.    Sulfamethoxazole-Trimethoprim Rash   Medications:  Current Outpatient Medications:    acetaminophen (TYLENOL) 500 MG tablet, Take 2 tablets (1,000 mg total) by mouth every 6 (six) hours., Disp: 30 tablet,  Rfl: 0   cetirizine-pseudoephedrine (ZYRTEC-D) 5-120 MG tablet, Take 1 tablet by mouth daily., Disp: 30 tablet, Rfl: 0   diphenhydrAMINE (BENADRYL) 25 MG tablet, Take 25 mg by mouth every 6 (six) hours as needed for itching., Disp: , Rfl:    fluticasone (FLONASE) 50 MCG/ACT nasal spray, Place 2 sprays into both nostrils daily., Disp: 16 g, Rfl: 0   gabapentin (NEURONTIN) 100 MG capsule, Take 1 capsule (100 mg total) by mouth 3 (three) times daily., Disp: 60 capsule, Rfl: 0   ibuprofen (ADVIL) 800 MG tablet, Take 1 tablet (800 mg total) by mouth every 6 (six) hours as needed for moderate pain., Disp: 20 tablet, Rfl: 0   loratadine (CLARITIN) 10 MG tablet, Take 10 mg by mouth daily as needed for allergies., Disp: , Rfl:    Melatonin 1 MG TABS, Take 2 mg by mouth at bedtime as needed (for sleep)., Disp: , Rfl:    nicotine (NICODERM CQ - DOSED IN MG/24 HR) 7 mg/24hr patch, Place 7 mg onto the skin daily., Disp: , Rfl:    nitrofurantoin, macrocrystal-monohydrate, (MACROBID) 100 MG capsule, Take 1 capsule (100 mg total) by mouth 2 (two) times daily., Disp: 10 capsule, Rfl: 0  Observations/Objective: Patient is well-developed, well-nourished in no acute distress.  Resting comfortably at home.  Head is normocephalic, atraumatic.  No labored breathing.  Speech is clear and coherent with logical content.  Patient is alert and oriented at baseline.  Diffuse, erythematous small, annular, flat papular rash noted on stomach  Assessment and Plan: 1. Allergic reaction, initial encounter  - Continue medications as prescribed - Expectant course verbally discussed - Also reviewed labs from ER visit with patient and answered questions - Suspect abnormal findings secondary to dehydration, advised to follow up with PCP in 2-3 weeks for recheck of abnormal labs/UA to make sure return to baseline - Also rash less likely to be petechial as no anemia and platelets normal. - Bilirubinuria suspected to be from  concentrated urine/dehydration at this time since Bilirubin, albumin, and LFTS all were WNL - Follow up with PCP in coming weeks or sooner if not improving with current treatment or if symptoms change or worsen  Follow Up Instructions: I discussed the assessment and treatment plan with the patient. The patient was provided an opportunity to ask questions and all were answered. The patient agreed with the plan and demonstrated an understanding of the instructions.  A copy of instructions were sent to the patient via MyChart unless otherwise noted below.    The patient was advised to call back or seek an in-person evaluation if the symptoms worsen or if the condition fails to improve as anticipated.  Time:  I spent 20 minutes with the patient via telehealth technology discussing the above problems/concerns.    Mar Daring, PA-C

## 2022-05-23 NOTE — Patient Instructions (Signed)
Cameron Chavez, thank you for joining Mar Daring, Chavez-C for today's virtual visit.  While this provider is not your primary care provider (PCP), if your PCP is located in our provider database this encounter information will be shared with them immediately following your visit.   [facility] account gives you access to today's visit and all your visits, tests, and labs performed at [facility] " click here if you don't have a [facility] account or go to [email or web address]  Consent: (Patient) Cameron Chavez provided verbal consent for this virtual visit at the beginning of the encounter.  Current Medications:  Current Outpatient Medications:    acetaminophen (TYLENOL) 500 MG tablet, Take 2 tablets (1,000 mg total) by mouth every 6 (six) hours., Disp: 30 tablet, Rfl: 0   cetirizine-pseudoephedrine (ZYRTEC-D) 5-120 MG tablet, Take 1 tablet by mouth daily., Disp: 30 tablet, Rfl: 0   diphenhydrAMINE (BENADRYL) 25 MG tablet, Take 25 mg by mouth every 6 (six) hours as needed for itching., Disp: , Rfl:    fluticasone (FLONASE) 50 MCG/ACT nasal spray, Place 2 sprays into both nostrils daily., Disp: 16 g, Rfl: 0   gabapentin (NEURONTIN) 100 MG capsule, Take 1 capsule (100 mg total) by mouth 3 (three) times daily., Disp: 60 capsule, Rfl: 0   ibuprofen (ADVIL) 800 MG tablet, Take 1 tablet (800 mg total) by mouth every 6 (six) hours as needed for moderate pain., Disp: 20 tablet, Rfl: 0   loratadine (CLARITIN) 10 MG tablet, Take 10 mg by mouth daily as needed for allergies., Disp: , Rfl:    Melatonin 1 MG TABS, Take 2 mg by mouth at bedtime as needed (for sleep)., Disp: , Rfl:    nicotine (NICODERM CQ - DOSED IN MG/24 HR) 7 mg/24hr patch, Place 7 mg onto the skin daily., Disp: , Rfl:    nitrofurantoin, macrocrystal-monohydrate, (MACROBID) 100 MG capsule, Take 1 capsule (100 mg total) by mouth 2 (two) times daily., Disp: 10 capsule, Rfl: 0   Medications ordered  in this encounter:  No orders of the defined types were placed in this encounter.    *If you need refills on other medications prior to your next appointment, please contact your pharmacy*  Follow-Up: Call back or seek an in-person evaluation if the symptoms worsen or if the condition fails to improve as anticipated.  [facility] [phone]  Other Instructions Drug Rash  A drug rash occurs when a medicine causes a change in the color or texture of the skin. It can develop minutes, hours, or days after you take the medicine. The rash may appear on a small area of skin or all over your body. What are the causes? This condition may be caused by one of these three conditions: An allergic reaction to the medicine. An unwanted side effect of a certain medicine. Extreme sensitivity to sunlight caused by the medicine. What increases the risk? If you take any of these medicines that make your skin sensitive to light and are exposed to sunlight, it can make you more likely to develop this condition: Antibiotics, including tetracyclines and sulfa medicines. Antifungals. Antihistamines. Diuretics. Retinoids, such as isotretinoin. Statins. NSAIDs. What are the signs or symptoms? Symptoms of this condition include: Redness. Tiny bumps. Peeling. Itching. Itchy welts (hives). Swelling. How is this diagnosed? This condition may be diagnosed based on: A physical exam. Tests to find out which medicine caused the rash. These tests may include: Skin tests. Blood tests. How  is this treated? This condition is treated with medicines, including: Antihistamine. This may be given to relieve itching. NSAIDs. These may be given to reduce swelling and to treat pain. A steroid medicine. This may be given to reduce swelling. The rash usually goes away when you stop taking the medicine that caused it. Follow these instructions at home: Take over-the-counter and prescription  medicines only as told by your health care provider. Tell all your health care providers about any medicine reactions that you have had in the past. If your rash was caused by sensitivity to sunlight, and while your rash is healing: Avoid being in the sun if possible, especially when it is strongest, usually between 10 a.m. and 4 p.m. Cover your skin with pants, long sleeves, and a hat when you are exposed to sunlight. If you have hives: Take a cool shower or use a cool compress to relieve itchiness. Take over-the-counter antihistamines, as recommended by your health care provider, until the hives are gone. Hives are not contagious. Keep all follow-up visits. This is important. Contact a health care provider if: You have fever. Your rash is not going away. Your rash gets worse. Your rash comes back. You have high-pitched whistling sounds when you breathe, most often when you breathe out (wheezing) or coughing. Get help right away if: You start to have breathing problems. You start to have shortness of breath. Your face or throat starts to swell. You have severe weakness with dizziness or fainting. You have chest pain. Your skin starts to blister and peel. These symptoms may represent a serious problem that is an emergency. Do not wait to see if the symptoms will go away. Get medical help right away. Call your local emergency services (911 in the [place] Do not drive yourself to the hospital. Summary A drug rash occurs when a medicine causes a change in the color or texture of the skin. The rash may appear on a small area of skin or all over your body. It can develop minutes, hours, or days after you take the medicine. Your health care provider will do various tests to determine what medicine caused your rash. The rash may be treated with medicine to relieve itching, swelling, and pain. This information is not intended to replace advice given to you by your health care provider. Make sure  you discuss any questions you have with your health care provider. Document Revised: 09/28/2020 Document Reviewed: 09/28/2020 Elsevier Patient Education  [place].    If you have been instructed to have an in-person evaluation today at a local Urgent Care facility, please use the link below. It will take you to a list of all of our available [facility], including address, phone number and hours of operation. Please do not delay care.  [facility]  If you or a family member do not have a primary care provider, use the link below to schedule a visit and establish care. When you choose a [facility] primary care physician or advanced practice provider, you gain a long-term partner in health. Find a Primary Care Provider  Learn more about [facility] in-office and virtual care options: [facility]

## 2022-06-09 ENCOUNTER — Ambulatory Visit: Payer: BC Managed Care – PPO | Admitting: Orthopaedic Surgery

## 2022-06-09 ENCOUNTER — Ambulatory Visit (INDEPENDENT_AMBULATORY_CARE_PROVIDER_SITE_OTHER): Payer: BC Managed Care – PPO

## 2022-06-09 ENCOUNTER — Encounter: Payer: Self-pay | Admitting: Orthopaedic Surgery

## 2022-06-09 VITALS — Ht 70.0 in | Wt 253.0 lb

## 2022-06-09 DIAGNOSIS — G8929 Other chronic pain: Secondary | ICD-10-CM

## 2022-06-09 DIAGNOSIS — M25511 Pain in right shoulder: Secondary | ICD-10-CM | POA: Diagnosis not present

## 2022-06-09 DIAGNOSIS — D1601 Benign neoplasm of scapula and long bones of right upper limb: Secondary | ICD-10-CM | POA: Diagnosis not present

## 2022-06-09 NOTE — Progress Notes (Addendum)
Office Visit Note   Patient: Cameron Chavez           Date of Birth: Aug 26, 1990           MRN: [id number] Visit Date: 06/09/2022              Requested by: Leslie Andrea, MD [address] [place],  [place] [address] PCP: Leslie Andrea, MD   Assessment & Plan: Visit Diagnoses:  1. Chronic right shoulder pain   2. Osteochondroma of humerus, right     Plan: Patient has noted this for greater than 15 years.  We discussed MRI imaging he will consider this and call if he like to proceed currently is not symptomatic and has a typical appearance of a benign osteochondroma that is pedunculated.  We discussed low risk of malignant transformation.  Follow-Up Instructions: No follow-ups on file.   Orders:  Orders Placed This Encounter  Procedures   XR Shoulder Right   No orders of the defined types were placed in this encounter.     Procedures: No procedures performed   Clinical Data: No additional findings.   Subjective: Chief Complaint  Patient presents with   Right Shoulder - Pain    HPI 32 year old male with popping right shoulder prominence noted just below the acromion about 5 to 6 cm.  States has been present there forever but whenever he works out [employer] people always note an audible pop that occurs in his shoulder not particularly painful.  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  He is concerned that he needs to be stronger.  Patient right-hand-dominant.  Review of Systems all systems noncontributory.   Objective: Vital Signs: Ht '5\' 10"'$  (1.778 m)   Wt 253 lb (114.8 kg)   BMI 36.30 kg/m   Physical Exam Constitutional:      Appearance: He is well-developed.  HENT:     Head: Normocephalic and atraumatic.     Right Ear: External ear normal.     Left Ear: External ear normal.  Eyes:     Pupils: Pupils are equal, round, and reactive to light.  Neck:     Thyroid: No thyromegaly.     Trachea: No tracheal deviation.   Cardiovascular:     Rate and Rhythm: Normal rate.  Pulmonary:     Effort: Pulmonary effort is normal.     Breath sounds: No wheezing.  Abdominal:     General: Bowel sounds are normal.     Palpations: Abdomen is soft.  Musculoskeletal:     Cervical back: Neck supple.  Skin:    General: Skin is warm and dry.     Capillary Refill: Capillary refill takes less than 2 seconds.  Neurological:     Mental Status: He is alert and oriented to person, place, and time.  Psychiatric:        Behavior: Behavior normal.        Thought Content: Thought content normal.        Judgment: Judgment normal.     Ortho Exam palpable nodule noted halfway between the acromion and the deltoid insertion that by palpation feels similar to intramuscular lipoma.  No subluxation of the shoulder with internal/external rotation pop is noted.  Specialty Comments:  No specialty comments available.  Imaging: XR Shoulder Right  Result Date: 06/09/2022 Three-view x-rays right shoulder obtained AP Y scapular view and abduction view.  Patient has no glenohumeral arthritis.  Benign pedunculated lesion 22 mm in length  that arises 3 cm below the greater tuberosity.  Appearance of a pedunculated osteochondroma.  No previous x-rays for comparison no MRI. Impression: Right shoulder osteochondroma lateral cortex inferior to the greater tuberosity 21 x 10 mm.    PMFS History: Patient Active Problem List   Diagnosis Date Noted   Osteochondroma of humerus, right 06/09/2022   Submandibular swelling 11/10/2018   No past medical history on file.  Family History  Problem Relation Age of Onset   Diabetes Other    Heart disease Father     Past Surgical History:  Procedure Laterality Date   INCISION AND DRAINAGE ABSCESS Right 11/10/2018   Procedure: INCISION AND DRAINAGE OF MANDIBLE UNDER ANESTHESIA;  Surgeon: Diona Browner, DDS;  Location: [facility];  Service: Oral Surgery;  Laterality: Right;   TOOTH EXTRACTION N/A 11/10/2018    Procedure: DENTAL RESTORATION/EXTRACTIONS;  Surgeon: Diona Browner, DDS;  Location: [facility];  Service: Oral Surgery;  Laterality: N/A;   WISDOM TOOTH EXTRACTION     Social History   Occupational History   Not on file  Tobacco Use   Smoking status: Every Day    Packs/day: 0.50    Years: 10.00    Total pack years: 5.00    Types: Cigarettes   Smokeless tobacco: Never  Vaping Use   Vaping Use: Never used  Substance and Sexual Activity   Alcohol use: No   Drug use: No   Sexual activity: Yes
